# Patient Record
Sex: Female | Born: 1975 | Race: White | Hispanic: No | Marital: Married
Health system: Southern US, Community
[De-identification: ages and names within clinical notes are randomized; demographics above are authoritative.]

## PROBLEM LIST (undated history)

## (undated) DIAGNOSIS — F32A Depression, unspecified: Secondary | ICD-10-CM

## (undated) DIAGNOSIS — J45909 Unspecified asthma, uncomplicated: Secondary | ICD-10-CM

## (undated) DIAGNOSIS — F419 Anxiety disorder, unspecified: Secondary | ICD-10-CM

## (undated) DIAGNOSIS — R7303 Prediabetes: Secondary | ICD-10-CM

## (undated) DIAGNOSIS — C539 Malignant neoplasm of cervix uteri, unspecified: Secondary | ICD-10-CM

## (undated) DIAGNOSIS — M549 Dorsalgia, unspecified: Secondary | ICD-10-CM

## (undated) DIAGNOSIS — E079 Disorder of thyroid, unspecified: Secondary | ICD-10-CM

## (undated) DIAGNOSIS — G2581 Restless legs syndrome: Secondary | ICD-10-CM

## (undated) HISTORY — DX: Anxiety disorder, unspecified: F41.9

## (undated) HISTORY — DX: Depression, unspecified: F32.A

## (undated) HISTORY — DX: Disorder of thyroid, unspecified: E07.9

## (undated) HISTORY — DX: Dorsalgia, unspecified: M54.9

## (undated) HISTORY — DX: Malignant neoplasm of cervix uteri, unspecified: C53.9

## (undated) HISTORY — PX: CARPAL TUNNEL RELEASE: SHX101

## (undated) HISTORY — PX: HYSTERECTOMY ABDOMINAL WITH SALPINGECTOMY: SHX6725

## (undated) HISTORY — PX: CHOLECYSTECTOMY: SHX55

## (undated) HISTORY — DX: Restless legs syndrome: G25.81

## (undated) HISTORY — DX: Unspecified asthma, uncomplicated: J45.909

---

## 2006-11-27 ENCOUNTER — Emergency Department: Payer: Self-pay | Admitting: Unknown Physician Specialty

## 2007-09-02 ENCOUNTER — Emergency Department: Payer: Self-pay | Admitting: Emergency Medicine

## 2007-11-27 ENCOUNTER — Ambulatory Visit: Payer: Self-pay | Admitting: Family Medicine

## 2008-05-27 ENCOUNTER — Emergency Department: Payer: Self-pay | Admitting: Emergency Medicine

## 2008-06-17 ENCOUNTER — Emergency Department: Payer: Self-pay | Admitting: Emergency Medicine

## 2008-12-02 ENCOUNTER — Ambulatory Visit: Payer: Self-pay

## 2011-08-03 DIAGNOSIS — Z8541 Personal history of malignant neoplasm of cervix uteri: Secondary | ICD-10-CM | POA: Insufficient documentation

## 2012-04-28 DIAGNOSIS — E038 Other specified hypothyroidism: Secondary | ICD-10-CM | POA: Insufficient documentation

## 2012-04-28 DIAGNOSIS — E039 Hypothyroidism, unspecified: Secondary | ICD-10-CM | POA: Insufficient documentation

## 2013-09-10 DIAGNOSIS — M5136 Other intervertebral disc degeneration, lumbar region: Secondary | ICD-10-CM | POA: Insufficient documentation

## 2013-09-10 DIAGNOSIS — M797 Fibromyalgia: Secondary | ICD-10-CM | POA: Insufficient documentation

## 2013-10-12 DIAGNOSIS — Z9049 Acquired absence of other specified parts of digestive tract: Secondary | ICD-10-CM | POA: Insufficient documentation

## 2013-11-16 LAB — IRON,TIBC AND FERRITIN PANEL
%SAT: 10
Ferritin: 51
Iron: 36
TIBC: 359

## 2014-02-26 DIAGNOSIS — Z8659 Personal history of other mental and behavioral disorders: Secondary | ICD-10-CM | POA: Insufficient documentation

## 2014-06-03 DIAGNOSIS — G5601 Carpal tunnel syndrome, right upper limb: Secondary | ICD-10-CM | POA: Insufficient documentation

## 2014-08-11 DIAGNOSIS — Z87891 Personal history of nicotine dependence: Secondary | ICD-10-CM

## 2016-03-25 ENCOUNTER — Emergency Department
Admission: EM | Admit: 2016-03-25 | Discharge: 2016-03-25 | Disposition: A | Discharge status: Home / Self Care | Payer: Self-pay | Attending: Emergency Medicine | Admitting: Emergency Medicine

## 2016-03-25 ENCOUNTER — Encounter: Payer: Self-pay | Admitting: Emergency Medicine

## 2016-03-25 ENCOUNTER — Emergency Department: Payer: Self-pay

## 2016-03-25 DIAGNOSIS — F17210 Nicotine dependence, cigarettes, uncomplicated: Secondary | ICD-10-CM | POA: Insufficient documentation

## 2016-03-25 DIAGNOSIS — J189 Pneumonia, unspecified organism: Secondary | ICD-10-CM | POA: Insufficient documentation

## 2016-03-25 HISTORY — DX: Prediabetes: R73.03

## 2016-03-25 MED ORDER — AZITHROMYCIN 250 MG PO TABS: ORAL_TABLET | ORAL | 0 refills | Status: DC

## 2016-03-25 MED ORDER — PSEUDOEPH-BROMPHEN-DM 30-2-10 MG/5ML PO SYRP: 10.0000 mL | ORAL_SOLUTION | Freq: Four times a day (QID) | ORAL | 0 refills | Status: DC | PRN

## 2016-03-25 MED ORDER — PREDNISONE 10 MG PO TABS: 10.0000 mg | ORAL_TABLET | Freq: Every day | ORAL | 0 refills | Status: DC

## 2016-03-25 MED ORDER — ALBUTEROL SULFATE HFA 108 (90 BASE) MCG/ACT IN AERS: 2.0000 | INHALATION_SPRAY | RESPIRATORY_TRACT | 0 refills | Status: DC | PRN

## 2016-03-25 NOTE — ED Triage Notes (Signed)
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 Pt states pain in her ribs and her back with cough at this time. Pt states "you know how you can taste an infection in your chest? I can taste it". Pt is noted to have cough in triage.

## 2016-03-25 NOTE — ED Provider Notes (Signed)
[facility] Emergency Department Provider Note  ____________________________________________  Time seen: Approximately 5:40 PM  I have reviewed the triage vital signs and the nursing notes.   HISTORY  Chief Complaint Cough and Pleurisy    HPI Cynthia Thompson is a 41 y.o. female who presents emergency department complaining of chronic cough 3-4 weeks. Per the patient, she works at a truck stop and states that were the truckers came in approximately 4 weeks ago, coughing around and on her. She reports that he had "pneumonia or something." Patient reports that she has had a cough with no other symptoms over the last 3 weeks. She states that overall she has felt "fine until the past several days" in which she reports that she has felt more tired and fatigued in the cough has worsened. She denies any fevers or chills, night sweats, hemoptysis. She denies any headache, visual changes, chest pain, shortness of breath, abdominal pain, nausea vomiting, diarrhea or constipation. She has tried Mucinex and Motrin for this complaint prior to arrival.   Past Medical History:  Diagnosis Date  . Borderline diabetes     There are no active problems to display for this patient.   Past Surgical History:  Procedure Laterality Date  . CARPAL TUNNEL RELEASE    . CHOLECYSTECTOMY    . HYSTERECTOMY ABDOMINAL WITH SALPINGECTOMY      Prior to Admission medications   Medication Sig Start Date End Date Taking? Authorizing Provider  albuterol (PROVENTIL HFA;VENTOLIN HFA) 108 (90 Base) MCG/ACT inhaler Inhale 2 puffs into the lungs every 4 (four) hours as needed for wheezing or shortness of breath. 03/25/16   Delorise Royals Cuthriell, PA-C  azithromycin (ZITHROMAX Z-PAK) 250 MG tablet Take 2 tablets (500 mg) on  Day 1,  followed by 1 tablet (250 mg) once daily on Days 2 through 5. 03/25/16   Christiane Ha D Cuthriell, PA-C  brompheniramine-pseudoephedrine-DM 30-2-10 MG/5ML syrup Take 10 mLs by  mouth 4 (four) times daily as needed. 03/25/16   Delorise Royals Cuthriell, PA-C  predniSONE (DELTASONE) 10 MG tablet Take 1 tablet (10 mg total) by mouth daily. 03/25/16   Delorise Royals Cuthriell, PA-C    Allergies Patient has no known allergies.  No family history on file.  Social History Social History  Substance Use Topics  . Smoking status: Current Every Day Smoker    Packs/day: 0.50    Types: Cigarettes  . Smokeless tobacco: Never Used  . Alcohol use No     Review of Systems  Constitutional: No fever/chills Eyes: No visual changes. No discharge ENT: No upper respiratory complaints. Cardiovascular: no chest pain. Respiratory: Positive cough. No SOB. Gastrointestinal: No abdominal pain.  No nausea, no vomiting.  No diarrhea.  No constipation. Musculoskeletal: Negative for musculoskeletal pain. Skin: Negative for rash, abrasions, lacerations, ecchymosis. Neurological: Negative for headaches, focal weakness or numbness. 10-point ROS otherwise negative.  ____________________________________________   PHYSICAL EXAM:  VITAL SIGNS: ED Triage Vitals  Enc Vitals Group     BP 03/25/16 1615 (!) 136/53     Pulse Rate 03/25/16 1615 86     Resp 03/25/16 1615 18     Temp 03/25/16 1615 97.9 F (36.6 C)     Temp Source 03/25/16 1615 Oral     SpO2 03/25/16 1615 98 %     Weight 03/25/16 1620 170 lb (77.1 kg)     Height 03/25/16 1620 5\' 1"  (1.549 m)     Head Circumference --      Peak Flow --  Pain Score 03/25/16 1615 10     Pain Loc --      Pain Edu? --      Excl. in GC? --      Constitutional: Alert and oriented. Well appearing and in no acute distress. Eyes: Conjunctivae are normal. PERRL. EOMI. Head: Atraumatic. ENT:      Ears: EACs and TMs unremarkable bilaterally      Nose: No congestion/rhinnorhea.      Mouth/Throat: Mucous membranes are moist.  Neck: No stridor. Neck is supple with full range of motion Hematological/Lymphatic/Immunilogical: No cervical  lymphadenopathy. Cardiovascular: Normal rate, regular rhythm. Normal S1 and S2.  Good peripheral circulation. Respiratory: Normal respiratory effort without tachypnea or retractions. Lungs with a few scattered expiratory wheezes. No rales or rhonchi.Peri Jefferson air entry to the bases with no decreased or absent breath sounds. Musculoskeletal: Full range of motion to all extremities. No gross deformities appreciated. Neurologic:  Normal speech and language. No gross focal neurologic deficits are appreciated.  Skin:  Skin is warm, dry and intact. No rash noted. Psychiatric: Mood and affect are normal. Speech and behavior are normal. Patient exhibits appropriate insight and judgement.   ____________________________________________   LABS (all labs ordered are listed, but only abnormal results are displayed)  Labs Reviewed - No data to display ____________________________________________  EKG   ____________________________________________  RADIOLOGY Festus Barren Cuthriell, personally viewed and evaluated these images (plain radiographs) as part of my medical decision making, as well as reviewing the written report by the radiologist.  Dg Chest 2 View  Result Date: 03/25/2016 CLINICAL DATA:  Cough and congestion for 3 weeks. EXAM: CHEST  2 VIEW COMPARISON:  11/27/2007 chest radiograph FINDINGS: The cardiomediastinal silhouette is unremarkable. There is no evidence of focal airspace disease, pulmonary edema, suspicious pulmonary nodule/mass, pleural effusion, or pneumothorax. No acute bony abnormalities are identified. IMPRESSION: No active cardiopulmonary disease. Electronically Signed   By: Harmon Pier M.D.   On: 03/25/2016 16:51    ____________________________________________    PROCEDURES  Procedure(s) performed:    Procedures    Medications - No data to display   ____________________________________________   INITIAL IMPRESSION / ASSESSMENT AND PLAN / ED  COURSE  Pertinent labs & imaging results that were available during my care of the patient were reviewed by me and considered in my medical decision making (see chart for details).  Review of the [place] CSRS was performed in accordance of the [facility] prior to dispensing any controlled drugs.     Patient's diagnosis is consistent with community acquired pneumonia. The patient's symptoms of 3-4 weeks of cough, it is likely a patient has Mycoplasma/walking pneumonia. X-ray reveals no consolidation or  lesions.. Patient will be discharged home with prescriptions for antibiotics, steroids, albuterol, cough syrup. Patient is to follow up with primary care as needed or otherwise directed. Patient is given ED precautions to return to the ED for any worsening or new symptoms.     ____________________________________________  FINAL CLINICAL IMPRESSION(S) / ED DIAGNOSES  Final diagnoses:  Community acquired pneumonia, unspecified laterality      NEW MEDICATIONS STARTED DURING THIS VISIT:  New Prescriptions   ALBUTEROL (PROVENTIL HFA;VENTOLIN HFA) 108 (90 BASE) MCG/ACT INHALER    Inhale 2 puffs into the lungs every 4 (four) hours as needed for wheezing or shortness of breath.   AZITHROMYCIN (ZITHROMAX Z-PAK) 250 MG TABLET    Take 2 tablets (500 mg) on  Day 1,  followed by 1 tablet (250 mg) once daily on Days  2 through 5.   BROMPHENIRAMINE-PSEUDOEPHEDRINE-DM 30-2-10 MG/5ML SYRUP    Take 10 mLs by mouth 4 (four) times daily as needed.   PREDNISONE (DELTASONE) 10 MG TABLET    Take 1 tablet (10 mg total) by mouth daily.        This chart was dictated using voice recognition software/Dragon. Despite best efforts to proofread, errors can occur which can change the meaning. Any change was purely unintentional.    Racheal PatchesJonathan D Cuthriell, PA-C 03/25/16 1743    Emily FilbertJonathan E Williams, MD 03/25/16 2013

## 2018-11-21 DIAGNOSIS — F419 Anxiety disorder, unspecified: Secondary | ICD-10-CM | POA: Insufficient documentation

## 2018-11-21 LAB — TSH: TSH: 17.27 — AB (ref 0.41–5.90)

## 2019-05-16 DIAGNOSIS — N200 Calculus of kidney: Secondary | ICD-10-CM | POA: Insufficient documentation

## 2019-09-20 ENCOUNTER — Other Ambulatory Visit: Payer: Self-pay

## 2019-09-20 ENCOUNTER — Emergency Department
Admission: EM | Admit: 2019-09-20 | Discharge: 2019-09-20 | Disposition: A | Discharge status: Home / Self Care | Payer: Medicaid - Out of State | Attending: Emergency Medicine | Admitting: Emergency Medicine

## 2019-09-20 ENCOUNTER — Emergency Department: Payer: Medicaid - Out of State

## 2019-09-20 DIAGNOSIS — Y929 Unspecified place or not applicable: Secondary | ICD-10-CM | POA: Diagnosis not present

## 2019-09-20 DIAGNOSIS — F17210 Nicotine dependence, cigarettes, uncomplicated: Secondary | ICD-10-CM | POA: Diagnosis not present

## 2019-09-20 DIAGNOSIS — S00261A Insect bite (nonvenomous) of right eyelid and periocular area, initial encounter: Secondary | ICD-10-CM | POA: Insufficient documentation

## 2019-09-20 DIAGNOSIS — W57XXXA Bitten or stung by nonvenomous insect and other nonvenomous arthropods, initial encounter: Secondary | ICD-10-CM | POA: Insufficient documentation

## 2019-09-20 DIAGNOSIS — Y939 Activity, unspecified: Secondary | ICD-10-CM | POA: Insufficient documentation

## 2019-09-20 DIAGNOSIS — X509XXA Other and unspecified overexertion or strenuous movements or postures, initial encounter: Secondary | ICD-10-CM | POA: Insufficient documentation

## 2019-09-20 DIAGNOSIS — Y999 Unspecified external cause status: Secondary | ICD-10-CM | POA: Insufficient documentation

## 2019-09-20 DIAGNOSIS — S93601A Unspecified sprain of right foot, initial encounter: Secondary | ICD-10-CM | POA: Insufficient documentation

## 2019-09-20 MED ORDER — PREDNISONE 10 MG PO TABS: 30.0000 mg | ORAL_TABLET | Freq: Every day | ORAL | 0 refills | Status: DC

## 2019-09-20 MED ORDER — DOXYCYCLINE HYCLATE 100 MG PO CAPS: 100.0000 mg | ORAL_CAPSULE | Freq: Two times a day (BID) | ORAL | 0 refills | Status: DC

## 2019-09-20 NOTE — Discharge Instructions (Signed)
Follow-up with your regular doctor if not improving in 3 to 5 days.  Return emergency department worsening.  Follow-up with orthopedics if you continue to have right foot pain.  Elevate and ice the foot.  Take Tylenol for pain as needed.

## 2019-09-20 NOTE — ED Provider Notes (Signed)
[facility] Emergency Department Provider Note  ____________________________________________   First MD Initiated Contact with Patient 09/20/19 1406     (approximate)  I have reviewed the triage vital signs and the nursing notes.   HISTORY  Chief Complaint Insect Bite    HPI Cynthia Thompson is a 44 y.o. female presents emergency department complaining of swelling to the area next to her right eye from a insect bite 2 days ago. States that some yellow clear drainage was coming out of the area it still stings and burns. Also patient is complaining of right foot pain. States she twisted it about 3 weeks ago and continues to have pain along the bottom of the foot. Sharp stinging and tingling. No other injuries or complaints at this time.    Past Medical History:  Diagnosis Date  . Borderline diabetes     There are no problems to display for this patient.   Past Surgical History:  Procedure Laterality Date  . CARPAL TUNNEL RELEASE    . CHOLECYSTECTOMY    . HYSTERECTOMY ABDOMINAL WITH SALPINGECTOMY      Prior to Admission medications   Medication Sig Start Date End Date Taking? Authorizing Provider  doxycycline (VIBRAMYCIN) 100 MG capsule Take 1 capsule (100 mg total) by mouth 2 (two) times daily. 09/20/19   Jaspreet Hollings, Roselyn Bering, PA-C  predniSONE (DELTASONE) 10 MG tablet Take 3 tablets (30 mg total) by mouth daily with breakfast. 09/20/19   Sherrie Mustache Roselyn Bering, PA-C    Allergies Cephalexin, Ibuprofen, and Latex  No family history on file.  Social History Social History   Tobacco Use  . Smoking status: Current Every Day Smoker    Packs/day: 1.00    Types: Cigarettes  . Smokeless tobacco: Never Used  Vaping Use  . Vaping Use: Never used  Substance Use Topics  . Alcohol use: No  . Drug use: No    Review of Systems  Constitutional: No fever/chills Eyes: No visual changes. ENT: No sore throat. Respiratory: Denies cough Cardiovascular: Denies chest  pain Gastrointestinal: Denies abdominal pain Genitourinary: Negative for dysuria. Musculoskeletal: Negative for back pain. Positive right foot pain Skin: Negative for rash. Positive bug bite Psychiatric: no mood changes,     ____________________________________________   PHYSICAL EXAM:  VITAL SIGNS: ED Triage Vitals  Enc Vitals Group     BP 09/20/19 1259 111/72     Pulse Rate 09/20/19 1259 (!) 53     Resp 09/20/19 1259 19     Temp 09/20/19 1259 98.1 F (36.7 C)     Temp Source 09/20/19 1259 Oral     SpO2 09/20/19 1259 97 %     Weight 09/20/19 1303 164 lb 14.5 oz (74.8 kg)     Height 09/20/19 1303 5\' 1"  (1.549 m)     Head Circumference --      Peak Flow --      Pain Score 09/20/19 1302 10     Pain Loc --      Pain Edu? --      Excl. in GC? --     Constitutional: Alert and oriented. Well appearing and in no acute distress. Eyes: Conjunctivae are normal. Scabbed bug bite area noted adjacent to the right eye, no drainage at this time, no cellulitis extending to the lid Head: Atraumatic. Nose: No congestion/rhinnorhea. Mouth/Throat: Mucous membranes are moist.   Neck:  supple no lymphadenopathy noted Cardiovascular: Normal rate, regular rhythm.  Respiratory: Normal respiratory effort.  No retractions, l  GU: deferred Musculoskeletal: FROM all extremities, warm and well perfused, right foot is tender along the dorsum around the 3rd and 4th toes, neurovascular is intact no bruising is noted Neurologic:  Normal speech and language.  Skin:  Skin is warm, dry and intact. No rash noted. Psychiatric: Mood and affect are normal. Speech and behavior are normal.  ____________________________________________   LABS (all labs ordered are listed, but only abnormal results are displayed)  Labs Reviewed - No data to display ____________________________________________   ____________________________________________  RADIOLOGY  X-ray the right foot is  negative  ____________________________________________   PROCEDURES  Procedure(s) performed: No  Procedures    ____________________________________________   INITIAL IMPRESSION / ASSESSMENT AND PLAN / ED COURSE  Pertinent labs & imaging results that were available during my care of the patient were reviewed by me and considered in my medical decision making (see chart for details).   Patient is 44 year old female who comes in the emergency department with 2 complaints. One is a bug bite next to her eye and the other is right foot pain. See HPI physical exam is consistent with a insect bite and right foot sprain.   Did order x-ray of the right foot due to the continued pain. Right foot x-rays negative for fracture.  I did explain the findings to the patient. Explained her this could be a tendon problem she should follow-up with orthopedics. Gave her a prescription for Keflex due to concerns of infection at the bug bite along with 30 mg prednisone for 3 days. She is to continue to wash the area with soap and water. Return emergency department for worsening. She discharged stable condition.     Cynthia Thompson was evaluated in Emergency Department on 09/20/2019 for the symptoms described in the history of present illness. She was evaluated in the context of the global COVID-19 pandemic, which necessitated consideration that the patient might be at risk for infection with the SARS-CoV-2 virus that causes COVID-19. Institutional protocols and algorithms that pertain to the evaluation of patients at risk for COVID-19 are in a state of rapid change based on information released by regulatory bodies including the CDC and federal and state organizations. These policies and algorithms were followed during the patient's care in the ED.    As part of my medical decision making, I reviewed the following data within the electronic MEDICAL RECORD NUMBER Nursing notes reviewed and incorporated, Old chart  reviewed, Radiograph reviewed , Notes from prior ED visits and [place] Controlled Substance Database  ____________________________________________   FINAL CLINICAL IMPRESSION(S) / ED DIAGNOSES  Final diagnoses:  Insect bite of right periocular area, initial encounter  Sprain of right foot, initial encounter      NEW MEDICATIONS STARTED DURING THIS VISIT:  New Prescriptions   DOXYCYCLINE (VIBRAMYCIN) 100 MG CAPSULE    Take 1 capsule (100 mg total) by mouth 2 (two) times daily.   PREDNISONE (DELTASONE) 10 MG TABLET    Take 3 tablets (30 mg total) by mouth daily with breakfast.     Note:  This document was prepared using Dragon voice recognition software and may include unintentional dictation errors.    Faythe Ghee, PA-C 09/20/19 1634    Chesley Noon, MD 09/21/19 [phone]

## 2019-09-20 NOTE — ED Notes (Signed)
See triage note  Presents with pain and swelling to right eye  States she was stung by something on weds  Swelling noted Friday and Saturday

## 2019-09-20 NOTE — ED Triage Notes (Signed)
Patient arrived via POV from home, AOx4 and ambulatory. Patient chief complaint is insect bite that happened on September 1st. Patient was bitten on right side of eye. No vision loss or redness. Eye lid is swollen.

## 2019-10-16 ENCOUNTER — Emergency Department: Payer: Medicaid - Out of State

## 2019-10-16 ENCOUNTER — Other Ambulatory Visit: Payer: Self-pay

## 2019-10-16 DIAGNOSIS — Z5321 Procedure and treatment not carried out due to patient leaving prior to being seen by health care provider: Secondary | ICD-10-CM | POA: Insufficient documentation

## 2019-10-16 DIAGNOSIS — M549 Dorsalgia, unspecified: Secondary | ICD-10-CM | POA: Insufficient documentation

## 2019-10-16 DIAGNOSIS — R059 Cough, unspecified: Secondary | ICD-10-CM | POA: Diagnosis not present

## 2019-10-16 DIAGNOSIS — R0781 Pleurodynia: Secondary | ICD-10-CM | POA: Insufficient documentation

## 2019-10-16 NOTE — ED Triage Notes (Signed)
Pt states coming in for a cough that has resulted in back and rib pain. Pt states cough started 1.5 weeks ago. Pt states her niece has been sick. Pt states she feels dehydrated and has episodes of "hot then cold."

## 2019-10-17 ENCOUNTER — Emergency Department
Admission: EM | Admit: 2019-10-17 | Discharge: 2019-10-18 | Disposition: A | Discharge status: Home / Self Care | Payer: Medicaid - Out of State | Attending: Emergency Medicine | Admitting: Emergency Medicine

## 2019-10-17 NOTE — ED Notes (Signed)
Called pt x2 without answer 

## 2020-06-27 ENCOUNTER — Emergency Department: Payer: Medicaid - Out of State

## 2020-06-27 ENCOUNTER — Encounter: Payer: Self-pay | Admitting: Radiology

## 2020-06-27 ENCOUNTER — Other Ambulatory Visit: Payer: Self-pay

## 2020-06-27 ENCOUNTER — Emergency Department
Admission: EM | Admit: 2020-06-27 | Discharge: 2020-06-27 | Disposition: A | Discharge status: Home / Self Care | Payer: Medicaid - Out of State | Attending: Emergency Medicine | Admitting: Emergency Medicine

## 2020-06-27 DIAGNOSIS — F17210 Nicotine dependence, cigarettes, uncomplicated: Secondary | ICD-10-CM | POA: Insufficient documentation

## 2020-06-27 DIAGNOSIS — Z20822 Contact with and (suspected) exposure to covid-19: Secondary | ICD-10-CM | POA: Diagnosis not present

## 2020-06-27 DIAGNOSIS — J069 Acute upper respiratory infection, unspecified: Secondary | ICD-10-CM | POA: Diagnosis not present

## 2020-06-27 DIAGNOSIS — R059 Cough, unspecified: Secondary | ICD-10-CM | POA: Diagnosis present

## 2020-06-27 DIAGNOSIS — J45901 Unspecified asthma with (acute) exacerbation: Secondary | ICD-10-CM | POA: Insufficient documentation

## 2020-06-27 DIAGNOSIS — Z28310 Unvaccinated for covid-19: Secondary | ICD-10-CM | POA: Diagnosis not present

## 2020-06-27 DIAGNOSIS — Z91010 Allergy to peanuts: Secondary | ICD-10-CM | POA: Insufficient documentation

## 2020-06-27 LAB — CBC
HCT: 40.9 % (ref 36.0–46.0)
Hemoglobin: 13.1 g/dL (ref 12.0–15.0)
MCH: 26.5 pg (ref 26.0–34.0)
MCHC: 32 g/dL (ref 30.0–36.0)
MCV: 82.8 fL (ref 80.0–100.0)
Platelets: 277 10*3/uL (ref 150–400)
RBC: 4.94 MIL/uL (ref 3.87–5.11)
RDW: 16.4 % — ABNORMAL HIGH (ref 11.5–15.5)
WBC: 10.8 10*3/uL — ABNORMAL HIGH (ref 4.0–10.5)
nRBC: 0 % (ref 0.0–0.2)

## 2020-06-27 LAB — BASIC METABOLIC PANEL
Anion gap: 5 (ref 5–15)
BUN: 16 mg/dL (ref 6–20)
CO2: 27 mmol/L (ref 22–32)
Calcium: 8.9 mg/dL (ref 8.9–10.3)
Chloride: 109 mmol/L (ref 98–111)
Creatinine, Ser: 0.77 mg/dL (ref 0.44–1.00)
GFR, Estimated: >60 mL/min (ref >60)
Glucose, Bld: 96 mg/dL (ref 70–99)
Potassium: 4.3 mmol/L (ref 3.5–5.1)
Sodium: 141 mmol/L (ref 135–145)

## 2020-06-27 LAB — TROPONIN I (HIGH SENSITIVITY): Troponin I (High Sensitivity): 4 ng/L (ref <18)

## 2020-06-27 LAB — RESP PANEL BY RT-PCR (FLU A&B, COVID) ARPGX2
Influenza A by PCR: NEGATIVE
Influenza B by PCR: NEGATIVE
SARS Coronavirus 2 by RT PCR: NEGATIVE

## 2020-06-27 LAB — D-DIMER, QUANTITATIVE: D-Dimer, Quant: 0.32 ug/mL-FEU (ref 0.00–0.50)

## 2020-06-27 MED ORDER — MAGNESIUM SULFATE 2 GM/50ML IV SOLN
2.0000 g | Freq: Once | INTRAVENOUS | Status: AC
  Administered 2020-06-27: 2 g via INTRAVENOUS
  Filled 2020-06-27: qty 50

## 2020-06-27 MED ORDER — METHYLPREDNISOLONE SODIUM SUCC 125 MG IJ SOLR
125.0000 mg | Freq: Once | INTRAMUSCULAR | Status: AC
  Administered 2020-06-27: 125 mg via INTRAVENOUS
  Filled 2020-06-27: qty 2

## 2020-06-27 MED ORDER — IPRATROPIUM-ALBUTEROL 0.5-2.5 (3) MG/3ML IN SOLN
3.0000 mL | Freq: Once | RESPIRATORY_TRACT | Status: AC
  Administered 2020-06-27: 3 mL via RESPIRATORY_TRACT
  Filled 2020-06-27: qty 3

## 2020-06-27 MED ORDER — PREDNISONE 20 MG PO TABS: 60.0000 mg | ORAL_TABLET | Freq: Every day | ORAL | 0 refills | Status: AC

## 2020-06-27 MED ORDER — ALBUTEROL SULFATE HFA 108 (90 BASE) MCG/ACT IN AERS: 2.0000 | INHALATION_SPRAY | Freq: Four times a day (QID) | RESPIRATORY_TRACT | 2 refills | Status: DC | PRN

## 2020-06-27 MED ORDER — ACETAMINOPHEN 500 MG PO TABS
1000.0000 mg | ORAL_TABLET | Freq: Once | ORAL | Status: AC
  Administered 2020-06-27: 1000 mg via ORAL
  Filled 2020-06-27: qty 2

## 2020-06-27 MED ORDER — ALBUTEROL SULFATE HFA 108 (90 BASE) MCG/ACT IN AERS
2.0000 | INHALATION_SPRAY | RESPIRATORY_TRACT | Status: DC | PRN
  Administered 2020-06-27: 2 via RESPIRATORY_TRACT
  Filled 2020-06-27: qty 6.7

## 2020-06-27 MED ORDER — ALBUTEROL SULFATE HFA 108 (90 BASE) MCG/ACT IN AERS: 2.0000 | INHALATION_SPRAY | RESPIRATORY_TRACT | Status: DC | PRN

## 2020-06-27 NOTE — ED Provider Notes (Signed)
[facility] Emergency Department Provider Note  ____________________________________________  Time seen: Approximately 3:58 AM  I have reviewed the triage vital signs and the nursing notes.   HISTORY  Chief Complaint Wheezing   HPI Cynthia Thompson is a 45 y.o. female with a history of asthma and smoking who presents for evaluation of shortness of breath.  Patient reports that she has had a dry cough and some hoarseness that started yesterday.  Has been having progressively worsening wheezing and shortness of breath.  Unfortunately she does not have her inhaler home.  This evening the shortness of breath became more severe.  She is also complaining of sharp right-sided chest pain radiating across to her upper back.  The pain started after the cough.  No personal or family history of blood clots, recent travel immobilization, leg pain or swelling, hemoptysis or exogenous hormones.  She denies fever or chills.  Has not been vaccinated against COVID.  Denies body aches vomiting or diarrhea  Past Medical History:  Diagnosis Date   Borderline diabetes     There are no problems to display for this patient.   Past Surgical History:  Procedure Laterality Date   CARPAL TUNNEL RELEASE     CHOLECYSTECTOMY     HYSTERECTOMY ABDOMINAL WITH SALPINGECTOMY      Prior to Admission medications   Medication Sig Start Date End Date Taking? Authorizing Provider  albuterol (VENTOLIN HFA) 108 (90 Base) MCG/ACT inhaler Inhale 2 puffs into the lungs every 6 (six) hours as needed for wheezing or shortness of breath. 06/27/20  Yes Don Perking, [place], [place]  predniSONE (DELTASONE) 20 MG tablet Take 3 tablets (60 mg total) by mouth daily with breakfast for 4 days. 06/27/20 07/01/20 Yes Charrisse Masley, [place], MD  doxycycline (VIBRAMYCIN) 100 MG capsule Take 1 capsule (100 mg total) by mouth 2 (two) times daily. 09/20/19   Faythe Ghee, PA-C    Allergies Cephalexin, Ibuprofen, and  Latex  No family history on file.  Social History Social History   Tobacco Use   Smoking status: Every Day    Packs/day: 1.00    Pack years: 0.00    Types: Cigarettes   Smokeless tobacco: Never  Vaping Use   Vaping Use: Never used  Substance Use Topics   Alcohol use: No   Drug use: No    Review of Systems  Constitutional: Negative for fever. Eyes: Negative for visual changes. ENT: Negative for sore throat. Neck: No neck pain  Cardiovascular: + R sided pleuritic chest pain. Respiratory: + shortness of breath, cough, wheezing Gastrointestinal: Negative for abdominal pain, vomiting or diarrhea. Genitourinary: Negative for dysuria. Musculoskeletal: Negative for back pain. Skin: Negative for rash. Neurological: Negative for headaches, weakness or numbness. Psych: No SI or HI  ____________________________________________   PHYSICAL EXAM:  VITAL SIGNS: ED Triage Vitals  Enc Vitals Group     BP 06/27/20 0219 (!) 119/58     Pulse Rate 06/27/20 0217 61     Resp 06/27/20 0217 (!) 24     Temp 06/27/20 0217 98.2 F (36.8 C)     Temp Source 06/27/20 0217 Oral     SpO2 06/27/20 0217 93 %     Weight 06/27/20 0217 170 lb (77.1 kg)     Height 06/27/20 0217 5\' 1"  (1.549 m)     Head Circumference --      Peak Flow --      Pain Score 06/27/20 0217 10     Pain Loc --  Pain Edu? --      Excl. in GC? --     Constitutional: Alert and oriented, mild respiratory distress  HEENT:      Head: Normocephalic and atraumatic.         Eyes: Conjunctivae are normal. Sclera is non-icteric.       Mouth/Throat: Mucous membranes are moist.       Neck: Supple with no signs of meningismus. Cardiovascular: Regular rate and rhythm. No murmurs, gallops, or rubs. 2+ symmetrical distal pulses are present in all extremities. No JVD. Respiratory: Mild increased work of breathing with tachypnea, wheezing bilaterally but not hypoxic Gastrointestinal: Soft, non tender, and non  distended. Musculoskeletal:  No edema, cyanosis, or erythema of extremities. Neurologic: Normal speech and language. Face is symmetric. Moving all extremities. No gross focal neurologic deficits are appreciated. Skin: Skin is warm, dry and intact. No rash noted. Psychiatric: Mood and affect are normal. Speech and behavior are normal.  ____________________________________________   LABS (all labs ordered are listed, but only abnormal results are displayed)  Labs Reviewed  CBC - Abnormal; Notable for the following components:      Result Value   WBC 10.8 (*)    RDW 16.4 (*)    All other components within normal limits  RESP PANEL BY RT-PCR (FLU A&B, COVID) ARPGX2  D-DIMER, QUANTITATIVE  BASIC METABOLIC PANEL  TROPONIN I (HIGH SENSITIVITY)   ____________________________________________  EKG  ED ECG REPORT I, Nita Sickle, the attending physician, personally viewed and interpreted this ECG.  Normal sinus rhythm, rate of 60, normal intervals, normal axis, no ST elevations or depressions ____________________________________________  RADIOLOGY  I have personally reviewed the images performed during this visit and I agree with the Radiologist's read.   Interpretation by Radiologist:  DG Chest 2 View  Result Date: 06/27/2020 CLINICAL DATA:  Shortness of breath EXAM: CHEST - 2 VIEW COMPARISON:  Radiograph 03/25/2016 FINDINGS: No consolidation, features of edema, pneumothorax, or effusion. Pulmonary vascularity is normally distributed. The cardiomediastinal contours are unremarkable. Telemetry leads overlie the chest. Degenerative changes are present in the imaged spine and shoulders. No acute or worrisome osseous or chest wall abnormalities. IMPRESSION: No acute cardiopulmonary abnormality. Electronically Signed   By: Kreg Shropshire M.D.   On: 06/27/2020 03:10     ____________________________________________   PROCEDURES  Procedure(s) performed:yes .1-3 Lead EKG  Interpretation  Date/Time: 06/27/2020 4:01 AM Performed by: Nita Sickle, MD Authorized by: Nita Sickle, MD     Interpretation: non-specific     ECG rate assessment: normal     Rhythm: sinus rhythm     Ectopy: none     Conduction: normal    Critical Care performed: none ____________________________________________   INITIAL IMPRESSION / ASSESSMENT AND PLAN / ED COURSE  45 y.o. female with a history of asthma and smoking who presents for evaluation of shortness of breath, dry cough, hoarseness, congestion, and wheezing.  Patient also complained of right-sided pleuritic chest pain.  Patient arrives in mild respiratory distress, wheezing and tachypneic with normal sats.  Looks euvolemic.  No asymmetric leg swelling.  EKG with no signs of ischemia, dysrhythmias, or pericarditis.  Here she is afebrile.  Troponin negative with no signs of myocarditis.  COVID and flu negative.  D-dimer was sent to due to the pleuritic chest pain and that was negative for PE.  Chest x-ray visualized by me with no acute findings, confirmed by radiology.  No significant electrolyte derangements.  Patient was given 3 duo nebs, Solu-Medrol and IV magnesium  with significant improvement of her symptoms.  Sats are now 95 to 98%.  She is moving good air.  Since patient does not have albuterol at home we will send her home with an inhaler.  4 more days of prednisone and close follow-up with her primary care doctor.  Discussed my standard return precautions      _____________________________________________ Please note:  Patient was evaluated in Emergency Department today for the symptoms described in the history of present illness. Patient was evaluated in the context of the global COVID-19 pandemic, which necessitated consideration that the patient might be at risk for infection with the SARS-CoV-2 virus that causes COVID-19. Institutional protocols and algorithms that pertain to the evaluation of patients at  risk for COVID-19 are in a state of rapid change based on information released by regulatory bodies including the CDC and federal and state organizations. These policies and algorithms were followed during the patient's care in the ED.  Some ED evaluations and interventions may be delayed as a result of limited staffing during the pandemic.   [place] Controlled Substance Database was reviewed by me. ____________________________________________   FINAL CLINICAL IMPRESSION(S) / ED DIAGNOSES   Final diagnoses:  Viral URI with cough  Exacerbation of asthma, unspecified asthma severity, unspecified whether persistent      NEW MEDICATIONS STARTED DURING THIS VISIT:  ED Discharge Orders          Ordered    albuterol (VENTOLIN HFA) 108 (90 Base) MCG/ACT inhaler  Every 6 hours PRN        06/27/20 0403    predniSONE (DELTASONE) 20 MG tablet  Daily with breakfast        06/27/20 0403             Note:  This document was prepared using Dragon voice recognition software and may include unintentional dictation errors.    Don Perking, [place], MD 06/27/20 [phone]

## 2020-06-27 NOTE — ED Triage Notes (Signed)
Pt is wheezing, shob. Pt states began yesterday, history of asthma. Pt is having upper back pain, strong cough noted. Pt denies fever. Pt speaking in short sentences.

## 2020-06-27 NOTE — ED Notes (Signed)
Patient transported to X-ray 

## 2020-09-27 ENCOUNTER — Encounter: Payer: Self-pay | Admitting: Emergency Medicine

## 2020-09-27 ENCOUNTER — Emergency Department: Payer: Medicaid - Out of State

## 2020-09-27 DIAGNOSIS — U071 COVID-19: Secondary | ICD-10-CM | POA: Insufficient documentation

## 2020-09-27 DIAGNOSIS — F17210 Nicotine dependence, cigarettes, uncomplicated: Secondary | ICD-10-CM | POA: Insufficient documentation

## 2020-09-27 DIAGNOSIS — M7918 Myalgia, other site: Secondary | ICD-10-CM | POA: Diagnosis present

## 2020-09-27 DIAGNOSIS — Z91040 Latex allergy status: Secondary | ICD-10-CM | POA: Diagnosis not present

## 2020-09-27 DIAGNOSIS — Z20822 Contact with and (suspected) exposure to covid-19: Secondary | ICD-10-CM | POA: Insufficient documentation

## 2020-09-27 MED ORDER — ACETAMINOPHEN 500 MG PO TABS
ORAL_TABLET | ORAL | Status: AC
  Filled 2020-09-27: qty 2

## 2020-09-27 MED ORDER — ACETAMINOPHEN 500 MG PO TABS
1000.0000 mg | ORAL_TABLET | Freq: Once | ORAL | Status: AC
  Administered 2020-09-27: 1000 mg via ORAL

## 2020-09-27 NOTE — ED Triage Notes (Signed)
Pt c/o fever, generalized body aches and HA x3 days. Pt last took OTC Excedrin at 1300 today with no relief. Pt noticed cough and congestion over the last day. Unknown of recent exposure to ill persons. Pt denies SOB.

## 2020-09-28 ENCOUNTER — Emergency Department
Admission: EM | Admit: 2020-09-28 | Discharge: 2020-09-28 | Disposition: A | Discharge status: Home / Self Care | Payer: Medicaid - Out of State | Attending: Emergency Medicine | Admitting: Emergency Medicine

## 2020-09-28 DIAGNOSIS — U071 COVID-19: Secondary | ICD-10-CM

## 2020-09-28 LAB — RESP PANEL BY RT-PCR (FLU A&B, COVID) ARPGX2
Influenza A by PCR: NEGATIVE
Influenza B by PCR: NEGATIVE
SARS Coronavirus 2 by RT PCR: POSITIVE — AB

## 2020-09-28 MED ORDER — LACTATED RINGERS IV BOLUS
1000.0000 mL | Freq: Once | INTRAVENOUS | Status: AC
  Administered 2020-09-28: 1000 mL via INTRAVENOUS

## 2020-09-28 MED ORDER — NIRMATRELVIR/RITONAVIR (PAXLOVID)TABLET
3.0000 | ORAL_TABLET | Freq: Two times a day (BID) | ORAL | Status: DC
  Administered 2020-09-28: 3 via ORAL
  Filled 2020-09-28: qty 30

## 2020-09-28 MED ORDER — KETOROLAC TROMETHAMINE 30 MG/ML IJ SOLN
15.0000 mg | Freq: Once | INTRAMUSCULAR | Status: AC
  Administered 2020-09-28: 15 mg via INTRAVENOUS
  Filled 2020-09-28: qty 1

## 2020-09-28 NOTE — ED Provider Notes (Signed)
[facility] Emergency Department Provider Note  ____________________________________________  Time seen: Approximately 3:15 AM  I have reviewed the triage vital signs and the nursing notes.   HISTORY  Chief Complaint Fever, Generalized Body Aches, and Headache   HPI Cynthia Thompson is a 45 y.o. female with history of chronic back pain borderline diabetes who presents for evaluation of COVID-like symptoms.  Patient has had symptoms for 3 days.  She is complaining of generalized body aches, fever, cough, congestion, generalized throbbing headache.  Unvaccinated.  No known sick contact exposures.  Denies chest pain or shortness of breath.  Denies vomiting or diarrhea.  Has been taking over-the-counter Excedrin for her symptoms.   Past Medical History:  Diagnosis Date   Borderline diabetes     There are no problems to display for this patient.   Past Surgical History:  Procedure Laterality Date   CARPAL TUNNEL RELEASE     CHOLECYSTECTOMY     HYSTERECTOMY ABDOMINAL WITH SALPINGECTOMY      Prior to Admission medications   Medication Sig Start Date End Date Taking? Authorizing Provider  albuterol (VENTOLIN HFA) 108 (90 Base) MCG/ACT inhaler Inhale 2 puffs into the lungs every 6 (six) hours as needed for wheezing or shortness of breath. 06/27/20   Nita Sickle, MD  doxycycline (VIBRAMYCIN) 100 MG capsule Take 1 capsule (100 mg total) by mouth 2 (two) times daily. 09/20/19   Faythe Ghee, PA-C    Allergies Cephalexin, Ibuprofen, and Latex  History reviewed. No pertinent family history.  Social History Social History   Tobacco Use   Smoking status: Every Day    Packs/day: 1.00    Types: Cigarettes   Smokeless tobacco: Never  Vaping Use   Vaping Use: Never used  Substance Use Topics   Alcohol use: No   Drug use: No    Review of Systems  Constitutional: + fever, chills, body aches Eyes: Negative for visual changes. ENT: Negative for  sore throat. + congestion Neck: No neck pain  Cardiovascular: Negative for chest pain. Respiratory: Negative for shortness of breath. + cough Gastrointestinal: Negative for abdominal pain, vomiting or diarrhea. Genitourinary: Negative for dysuria. Musculoskeletal: Negative for back pain. Skin: Negative for rash. Neurological: Negative for weakness or numbness. + HA Psych: No SI or HI  ____________________________________________   PHYSICAL EXAM:  VITAL SIGNS: ED Triage Vitals  Enc Vitals Group     BP 09/27/20 2126 118/83     Pulse Rate 09/27/20 2126 99     Resp 09/27/20 2126 20     Temp 09/27/20 2126 (!) 101.3 F (38.5 C)     Temp Source 09/27/20 2126 Oral     SpO2 09/27/20 2126 96 %     Weight 09/27/20 2127 165 lb (74.8 kg)     Height 09/27/20 2127 5\' 1"  (1.549 m)     Head Circumference --      Peak Flow --      Pain Score 09/28/20 0057 7     Pain Loc --      Pain Edu? --      Excl. in GC? --     Constitutional: Alert and oriented. Well appearing and in no apparent distress. HEENT:      Head: Normocephalic and atraumatic.         Eyes: Conjunctivae are normal. Sclera is non-icteric.       Mouth/Throat: Mucous membranes are moist.       Neck: Supple with no signs of  meningismus. Cardiovascular: Regular rate and rhythm. No murmurs, gallops, or rubs. 2+ symmetrical distal pulses are present in all extremities. No JVD. Respiratory: Normal respiratory effort. Lungs are clear to auscultation bilaterally.  Gastrointestinal: Soft, non tender, and non distended with positive bowel sounds. No rebound or guarding. Genitourinary: No CVA tenderness. Musculoskeletal:  No edema, cyanosis, or erythema of extremities. Neurologic: Normal speech and language. Face is symmetric. Moving all extremities. No gross focal neurologic deficits are appreciated. Skin: Skin is warm, dry and intact. No rash noted. Psychiatric: Mood and affect are normal. Speech and behavior are  normal.  ____________________________________________   LABS (all labs ordered are listed, but only abnormal results are displayed)  Labs Reviewed  RESP PANEL BY RT-PCR (FLU A&B, COVID) ARPGX2 - Abnormal; Notable for the following components:      Result Value   SARS Coronavirus 2 by RT PCR POSITIVE (*)    All other components within normal limits   ____________________________________________  EKG  none  ____________________________________________  RADIOLOGY  I have personally reviewed the images performed during this visit and I agree with the Radiologist's read.   Interpretation by Radiologist:  DG Chest 2 View  Result Date: 09/27/2020 CLINICAL DATA:  Fever, COVID-19 positive, cough and congestion EXAM: CHEST - 2 VIEW COMPARISON:  06/27/2020 FINDINGS: The heart size and mediastinal contours are within normal limits. Both lungs are clear. The visualized skeletal structures are unremarkable. IMPRESSION: No active cardiopulmonary disease. Electronically Signed   By: Sharlet Salina M.D.   On: 09/27/2020 21:48     ____________________________________________   PROCEDURES  Procedure(s) performed:yes .1-3 Lead EKG Interpretation Performed by: Nita Sickle, MD Authorized by: Nita Sickle, MD     Interpretation: non-specific     ECG rate assessment: normal     Rhythm: sinus rhythm     Ectopy: none     Conduction: normal    Critical Care performed:  None ____________________________________________   INITIAL IMPRESSION / ASSESSMENT AND PLAN / ED COURSE  45 y.o. female with history of chronic back pain borderline diabetes who presents for evaluation of COVID-like symptoms x 3 days.  Patient has no chest pain, no shortness of breath, she has normal vital signs, normal work of breathing normal sats, lungs are clear to auscultation.  COVID positive.  Chest x-ray visualized by me with no signs of COVID-pneumonia.  Patient was given IV fluids, IV Toradol and  Tylenol for symptoms.  Discussed immune system boosting, oxygen monitoring, symptom relief, quarantine, and follow-up with PCP.  Patient is interested in Paxlovid.  Risks and benefits were discussed with her.  Patient was started on it in the emergency room.  Discussed my standard return precautions for chest pain, shortness of breath or hypoxia.      _____________________________________________ Please note:  Patient was evaluated in Emergency Department today for the symptoms described in the history of present illness. Patient was evaluated in the context of the global COVID-19 pandemic, which necessitated consideration that the patient might be at risk for infection with the SARS-CoV-2 virus that causes COVID-19. Institutional protocols and algorithms that pertain to the evaluation of patients at risk for COVID-19 are in a state of rapid change based on information released by regulatory bodies including the CDC and federal and state organizations. These policies and algorithms were followed during the patient's care in the ED.  Some ED evaluations and interventions may be delayed as a result of limited staffing during the pandemic.   [address] Controlled Substance Database was reviewed by  me. ____________________________________________   FINAL CLINICAL IMPRESSION(S) / ED DIAGNOSES   Final diagnoses:  COVID-19      NEW MEDICATIONS STARTED DURING THIS VISIT:  ED Discharge Orders     None        Note:  This document was prepared using Dragon voice recognition software and may include unintentional dictation errors.    Don Perking, [place], MD 09/28/20 [phone]

## 2020-09-28 NOTE — Discharge Instructions (Addendum)
To help boost your immune system against COVID-19, please take:  - Vitamin D3 4,000 IU/day - Vitamin C 500-1,000?mg twice a day - Quercetin 250?mg twice a day - Zinc 100?mg/day - Melatonin 10?mg before bedtime (causes drowsiness) - Aspirin 325?mg/day (unless contraindicated) - Pulse Oximeter Monitoring of oxygen saturation is recommended - check your oxygen 3 times a day if less than 90% return to the ER  These medications are all over-the-counter and do not need a prescription.  QUARANTINE INSTRUCTION  Follow these instructions at home:  Protecting others To avoid spreading the illness to other people: Quarantine in your home for 5 days Household members: With no symptoms: who are vaccinated do not need to quarantine but are strongly encouraged to use masks to prevent infection.  With symptoms: should get tested and if positive should quarantine for 5 days. Wash your hands often with soap and water. If soap and water are not available, use an alcohol-based hand sanitizer. If you have not cleaned your hands, do not touch your face. Make sure that all people in your household wash their hands well and often. Cover your nose and mouth when you cough or sneeze. Throw away used tissues. Stay home if you have any cold-like or flu-like symptoms. General instructions Go to your local pharmacy and buy a pulse oximeter (this is a machine that measures your oxygen). Check your oxygen levels at least 3 times a day. If your oxygen level is 90% or less return to the emergency room immediately Take over-the-counter and prescription medicines only as told by your health care provider. If you need medication for fever take tylenol or ibuprofen Drink enough fluid to keep your urine pale yellow. Rest at home as directed by your health care provider. Do not give aspirin to a child with the flu, because of the association with Reye's syndrome. Do not use tobacco products, including cigarettes, chewing  tobacco, and e-cigarettes. If you need help quitting, ask your health care provider. Keep all follow-up visits as told by your health care provider. This is important. How is this prevented? Avoid areas where an outbreak has been reported. Avoid large groups of people. Keep a safe distance from people who are coughing and sneezing. Do not touch your face if you have not cleaned your hands. When you are around people who are sick or might be sick, wear a mask to protect yourself. Contact a health care provider if: You have symptoms of SARS (cough, fever, chest pain, shortness of breath) that are not getting better at home. You have a fever. If you have difficulty breathing go to your local ER or call 911   

## 2020-09-28 NOTE — ED Notes (Signed)
MD notified of positive covid.

## 2021-07-10 ENCOUNTER — Encounter: Payer: Self-pay | Admitting: Obstetrics and Gynecology

## 2021-07-10 ENCOUNTER — Observation Stay
Admission: EM | Admit: 2021-07-10 | Discharge: 2021-07-10 | Disposition: A | Discharge status: Home / Self Care | Payer: 59 | Attending: Obstetrics and Gynecology | Admitting: Obstetrics and Gynecology

## 2021-07-10 ENCOUNTER — Other Ambulatory Visit: Payer: Self-pay

## 2021-07-10 ENCOUNTER — Emergency Department: Payer: 59

## 2021-07-10 DIAGNOSIS — R739 Hyperglycemia, unspecified: Secondary | ICD-10-CM

## 2021-07-10 DIAGNOSIS — J45901 Unspecified asthma with (acute) exacerbation: Secondary | ICD-10-CM

## 2021-07-10 DIAGNOSIS — Z87891 Personal history of nicotine dependence: Secondary | ICD-10-CM

## 2021-07-10 DIAGNOSIS — Z20822 Contact with and (suspected) exposure to covid-19: Secondary | ICD-10-CM | POA: Diagnosis not present

## 2021-07-10 DIAGNOSIS — J9600 Acute respiratory failure, unspecified whether with hypoxia or hypercapnia: Secondary | ICD-10-CM | POA: Insufficient documentation

## 2021-07-10 DIAGNOSIS — Z720 Tobacco use: Secondary | ICD-10-CM

## 2021-07-10 DIAGNOSIS — J4520 Mild intermittent asthma, uncomplicated: Secondary | ICD-10-CM | POA: Diagnosis present

## 2021-07-10 DIAGNOSIS — R0603 Acute respiratory distress: Principal | ICD-10-CM | POA: Insufficient documentation

## 2021-07-10 DIAGNOSIS — J4551 Severe persistent asthma with (acute) exacerbation: Secondary | ICD-10-CM | POA: Insufficient documentation

## 2021-07-10 DIAGNOSIS — E669 Obesity, unspecified: Secondary | ICD-10-CM

## 2021-07-10 LAB — COMPREHENSIVE METABOLIC PANEL
ALT: 14 U/L (ref 0–44)
AST: 28 U/L (ref 15–41)
Albumin: 4.5 g/dL (ref 3.5–5.0)
Alkaline Phosphatase: 51 U/L (ref 38–126)
Anion gap: 10 (ref 5–15)
BUN: 11 mg/dL (ref 6–20)
CO2: 25 mmol/L (ref 22–32)
Calcium: 10.2 mg/dL (ref 8.9–10.3)
Chloride: 104 mmol/L (ref 98–111)
Creatinine, Ser: 0.9 mg/dL (ref 0.44–1.00)
GFR, Estimated: >60 mL/min (ref >60)
Glucose, Bld: 155 mg/dL — ABNORMAL HIGH (ref 70–99)
Potassium: 3.8 mmol/L (ref 3.5–5.1)
Sodium: 139 mmol/L (ref 135–145)
Total Bilirubin: 1.5 mg/dL — ABNORMAL HIGH (ref 0.3–1.2)
Total Protein: 8.8 g/dL — ABNORMAL HIGH (ref 6.5–8.1)

## 2021-07-10 LAB — CBC WITH DIFFERENTIAL/PLATELET
Abs Immature Granulocytes: 0.06 10*3/uL (ref 0.00–0.07)
Basophils Absolute: 0.1 10*3/uL (ref 0.0–0.1)
Basophils Relative: 1 %
Eosinophils Absolute: 0.4 10*3/uL (ref 0.0–0.5)
Eosinophils Relative: 2 %
HCT: 49.8 % — ABNORMAL HIGH (ref 36.0–46.0)
Hemoglobin: 16.6 g/dL — ABNORMAL HIGH (ref 12.0–15.0)
Immature Granulocytes: 0 %
Lymphocytes Relative: 37 %
Lymphs Abs: 5.4 10*3/uL — ABNORMAL HIGH (ref 0.7–4.0)
MCH: 28 pg (ref 26.0–34.0)
MCHC: 33.3 g/dL (ref 30.0–36.0)
MCV: 84.1 fL (ref 80.0–100.0)
Monocytes Absolute: 0.9 10*3/uL (ref 0.1–1.0)
Monocytes Relative: 6 %
Neutro Abs: 7.8 10*3/uL — ABNORMAL HIGH (ref 1.7–7.7)
Neutrophils Relative %: 54 %
Platelets: 351 10*3/uL (ref 150–400)
RBC: 5.92 MIL/uL — ABNORMAL HIGH (ref 3.87–5.11)
RDW: 14.6 % (ref 11.5–15.5)
Smear Review: NORMAL
WBC: 14.6 10*3/uL — ABNORMAL HIGH (ref 4.0–10.5)
nRBC: 0 % (ref 0.0–0.2)

## 2021-07-10 LAB — C-REACTIVE PROTEIN: CRP: 0.8 mg/dL (ref <1.0)

## 2021-07-10 LAB — BLOOD GAS, ARTERIAL
Acid-Base Excess: 2.3 mmol/L — ABNORMAL HIGH (ref 0.0–2.0)
Bicarbonate: 23.9 mmol/L (ref 20.0–28.0)
Delivery systems: POSITIVE
FIO2: 50 %
O2 Saturation: 100 %
Patient temperature: 37
pCO2 arterial: 28 mmHg — ABNORMAL LOW (ref 32–48)
pH, Arterial: 7.54 — ABNORMAL HIGH (ref 7.35–7.45)
pO2, Arterial: 215 mmHg — ABNORMAL HIGH (ref 83–108)

## 2021-07-10 LAB — SARS CORONAVIRUS 2 BY RT PCR: SARS Coronavirus 2 by RT PCR: NEGATIVE

## 2021-07-10 LAB — BRAIN NATRIURETIC PEPTIDE: B Natriuretic Peptide: 9.7 pg/mL (ref 0.0–100.0)

## 2021-07-10 LAB — PROCALCITONIN: Procalcitonin: <0.1 ng/mL

## 2021-07-10 LAB — TROPONIN I (HIGH SENSITIVITY)
Troponin I (High Sensitivity): 4 ng/L (ref <18)
Troponin I (High Sensitivity): 4 ng/L (ref <18)

## 2021-07-10 MED ORDER — IPRATROPIUM-ALBUTEROL 0.5-2.5 (3) MG/3ML IN SOLN
3.0000 mL | Freq: Once | RESPIRATORY_TRACT | Status: AC
Start: 2021-07-10 — End: 2021-07-10
  Administered 2021-07-10: 3 mL via RESPIRATORY_TRACT
  Filled 2021-07-10: qty 3

## 2021-07-10 MED ORDER — ALBUTEROL SULFATE HFA 108 (90 BASE) MCG/ACT IN AERS: 2.0000 | INHALATION_SPRAY | Freq: Four times a day (QID) | RESPIRATORY_TRACT | 2 refills | Status: DC | PRN

## 2021-07-10 MED ORDER — ACETAMINOPHEN 500 MG PO TABS
1000.0000 mg | ORAL_TABLET | Freq: Four times a day (QID) | ORAL | Status: DC | PRN
  Administered 2021-07-10: 1000 mg via ORAL
  Filled 2021-07-10: qty 2

## 2021-07-10 MED ORDER — IPRATROPIUM-ALBUTEROL 0.5-2.5 (3) MG/3ML IN SOLN
3.0000 mL | Freq: Once | RESPIRATORY_TRACT | Status: AC
  Administered 2021-07-10: 3 mL via RESPIRATORY_TRACT
  Filled 2021-07-10: qty 3

## 2021-07-10 MED ORDER — IPRATROPIUM-ALBUTEROL 0.5-2.5 (3) MG/3ML IN SOLN
3.0000 mL | Freq: Four times a day (QID) | RESPIRATORY_TRACT | Status: DC
  Administered 2021-07-10: 3 mL via RESPIRATORY_TRACT
  Filled 2021-07-10: qty 3

## 2021-07-10 MED ORDER — SPACER/AERO-HOLDING CHAMBERS DEVI: 1.0000 | 5 refills | Status: DC

## 2021-07-10 MED ORDER — RACEPINEPHRINE HCL 2.25 % IN NEBU
0.5000 mL | INHALATION_SOLUTION | Freq: Once | RESPIRATORY_TRACT | Status: AC
Start: 2021-07-10 — End: 2021-07-10
  Administered 2021-07-10: 0.5 mL via RESPIRATORY_TRACT
  Filled 2021-07-10: qty 0.5

## 2021-07-10 MED ORDER — FLUTICASONE PROPIONATE HFA 44 MCG/ACT IN AERO: 2.0000 | INHALATION_SPRAY | Freq: Two times a day (BID) | RESPIRATORY_TRACT | 2 refills | Status: DC

## 2021-07-10 MED ORDER — METHYLPREDNISOLONE SODIUM SUCC 125 MG IJ SOLR
80.0000 mg | INTRAMUSCULAR | Status: DC
Start: 2021-07-10 — End: 2021-07-10
  Administered 2021-07-10: 80 mg via INTRAVENOUS
  Filled 2021-07-10: qty 2

## 2021-07-10 MED ORDER — PREDNISONE 10 MG PO TABS: 40.0000 mg | ORAL_TABLET | Freq: Every day | ORAL | 0 refills | Status: AC

## 2021-07-10 MED ORDER — SODIUM CHLORIDE 0.9 % IV BOLUS
500.0000 mL | Freq: Once | INTRAVENOUS | Status: AC
Start: 2021-07-10 — End: 2021-07-10
  Administered 2021-07-10: 500 mL via INTRAVENOUS

## 2021-07-10 MED ORDER — ALBUTEROL SULFATE (2.5 MG/3ML) 0.083% IN NEBU: 2.5000 mg | INHALATION_SOLUTION | RESPIRATORY_TRACT | Status: DC | PRN

## 2021-07-10 MED ORDER — ONDANSETRON HCL 4 MG/2ML IJ SOLN
4.0000 mg | Freq: Once | INTRAMUSCULAR | Status: AC
Start: 2021-07-10 — End: 2021-07-10
  Administered 2021-07-10: 4 mg via INTRAVENOUS
  Filled 2021-07-10: qty 2

## 2021-10-03 ENCOUNTER — Other Ambulatory Visit: Payer: Self-pay | Admitting: Obstetrics and Gynecology

## 2021-11-25 ENCOUNTER — Other Ambulatory Visit: Payer: Self-pay | Admitting: Obstetrics and Gynecology

## 2022-05-21 ENCOUNTER — Other Ambulatory Visit: Payer: Self-pay

## 2022-05-21 ENCOUNTER — Emergency Department
Admission: EM | Admit: 2022-05-21 | Discharge: 2022-05-21 | Disposition: A | Discharge status: Home / Self Care | Payer: 59 | Attending: Emergency Medicine | Admitting: Emergency Medicine

## 2022-05-21 DIAGNOSIS — K029 Dental caries, unspecified: Secondary | ICD-10-CM | POA: Diagnosis not present

## 2022-05-21 DIAGNOSIS — K047 Periapical abscess without sinus: Secondary | ICD-10-CM

## 2022-05-21 DIAGNOSIS — J45909 Unspecified asthma, uncomplicated: Secondary | ICD-10-CM | POA: Diagnosis not present

## 2022-05-21 DIAGNOSIS — E039 Hypothyroidism, unspecified: Secondary | ICD-10-CM | POA: Insufficient documentation

## 2022-05-21 DIAGNOSIS — K0889 Other specified disorders of teeth and supporting structures: Secondary | ICD-10-CM | POA: Diagnosis present

## 2022-05-21 MED ORDER — CLINDAMYCIN HCL 300 MG PO CAPS: 300.0000 mg | ORAL_CAPSULE | Freq: Three times a day (TID) | ORAL | 0 refills | Status: DC

## 2022-05-21 MED ORDER — OXYCODONE HCL 5 MG PO TABS
5.0000 mg | ORAL_TABLET | Freq: Once | ORAL | Status: AC
  Administered 2022-05-21: 5 mg via ORAL
  Filled 2022-05-21: qty 1

## 2022-05-21 MED ORDER — CHLORHEXIDINE GLUCONATE 0.12 % MT SOLN: 15.0000 mL | Freq: Two times a day (BID) | OROMUCOSAL | 0 refills | Status: AC

## 2022-05-21 NOTE — Discharge Instructions (Signed)
Please follow-up with a dentist to soon as possible.  Please return for any new, worsening, or change in symptoms or other concerns.  You may take the antibiotics and the mouth rinse as prescribed.  It was a pleasure caring for you today.

## 2022-05-21 NOTE — ED Provider Notes (Signed)
[facility] Provider Note    Event Date/Time   First MD Initiated Contact with Patient 05/21/22 [phone]     (approximate)   History   Dental Pain   HPI  Cynthia Thompson is a 47 y.o. female with a past medical history of asthma, obesity, tobacco abuse, bipolar disorder who presents today for evaluation of dental pain.  Patient reports that she is decayed teeth in her bilateral lower teeth, and she has developed pain to her right lower teeth, and swelling this morning.  She reports that she has no trouble opening her mouth.  She has been able to handle her secretions without difficulty.  No fevers or chills.  No nuchal rigidity.  Patient Active Problem List   Diagnosis Date Noted   Severe asthma with acute exacerbation 07/10/2021   Obesity (BMI 30-39.9) 07/10/2021   Hyperglycemia 07/10/2021   Tobacco abuse 07/10/2021   Asthma with acute exacerbation 07/10/2021   History of bipolar disorder 02/26/2014   Hypothyroidism 04/28/2012          Physical Exam   Triage Vital Signs: ED Triage Vitals  Enc Vitals Group     BP 05/21/22 0818 130/69     Pulse Rate 05/21/22 0818 64     Resp 05/21/22 0818 18     Temp 05/21/22 0818 98.1 F (36.7 C)     Temp Source 05/21/22 0818 Oral     SpO2 05/21/22 0818 98 %     Weight 05/21/22 0816 190 lb 0.6 oz (86.2 kg)     Height --      Head Circumference --      Peak Flow --      Pain Score 05/21/22 0816 10     Pain Loc --      Pain Edu? --      Excl. in GC? --     Most recent vital signs: Vitals:   05/21/22 0818  BP: 130/69  Pulse: 64  Resp: 18  Temp: 98.1 F (36.7 C)  SpO2: 98%    Physical Exam Vitals and nursing note reviewed.  Constitutional:      General: Awake and alert. No acute distress.    Appearance: Normal appearance. The patient is normal weight.  HENT:     Head: Normocephalic and atraumatic.     Mouth: Mucous membranes are moist.  Diffuse dental decay bilaterally to lower teeth.  No  gingival fluctuance, there is gingival erythema noted.  Tenderness to palpation along the gingiva.  No purulent drainage.  No drooling.  No trismus.  No sublingual swelling.  No erythema to face or neck. Eyes:     General: PERRL. Normal EOMs        Right eye: No discharge.        Left eye: No discharge.     Conjunctiva/sclera: Conjunctivae normal.  Cardiovascular:     Rate and Rhythm: Normal rate and regular rhythm.     Pulses: Normal pulses.  Pulmonary:     Effort: Pulmonary effort is normal. No respiratory distress.     Breath sounds: Normal breath sounds.  Abdominal:     Abdomen is soft. There is no abdominal tenderness.  Musculoskeletal:        General: No swelling. Normal range of motion.     Cervical back: Normal range of motion and neck supple.  No nuchal rigidity Skin:    General: Skin is warm and dry.     Capillary Refill: Capillary refill takes  less than 2 seconds.     Findings: No rash.  Neurological:     Mental Status: The patient is awake and alert.      ED Results / Procedures / Treatments   Labs (all labs ordered are listed, but only abnormal results are displayed) Labs Reviewed - No data to display   EKG     RADIOLOGY     PROCEDURES:  Critical Care performed:   Procedures   MEDICATIONS ORDERED IN ED: Medications  oxyCODONE (Oxy IR/ROXICODONE) immediate release tablet 5 mg (5 mg Oral Given 05/21/22 0837)     IMPRESSION / MDM / ASSESSMENT AND PLAN / ED COURSE  I reviewed the triage vital signs and the nursing notes.   Differential diagnosis includes, but is not limited to, dental decay, dental caries, gingivitis, pulpitis, abscess.  Patient is awake and alert, hemodynamically stable and afebrile.  She is nontoxic in appearance.  Patient was evaluated in the emergency department for dental pain. Patient has tenderness to multiple teeth and diffusely poor dentition, I suspect some dental decay vs pulpitits. No gingival swelling or fluctuance  concerning for gingival abscess.  No trismus, nuchal rigidity, neck pain, hot potato voice, uvular deviation or malocclusion to suggest deep space infection. No sublingual swelling concerning for Ludwig's angina.  Patient was started on antibiotics and chlorhexidine mouth rinse.  Patient was treated symptomatically in the emergency department. Discussed care plan, return precautions, and advised close outpatient follow-up with dentist.  She was given a list of dental clinics.  Patient agrees with plan of care.   Patient's presentation is most consistent with acute illness / injury with system symptoms.      FINAL CLINICAL IMPRESSION(S) / ED DIAGNOSES   Final diagnoses:  Dental caries  Infected dental caries     Rx / DC Orders   ED Discharge Orders          Ordered    clindamycin (CLEOCIN) 300 MG capsule  3 times daily        05/21/22 0834    chlorhexidine (PERIDEX) 0.12 % solution  2 times daily        05/21/22 4098             Note:  This document was prepared using Dragon voice recognition software and may include unintentional dictation errors.   [name] 05/21/22 1191    Minna Antis, MD 05/21/22 [phone]

## 2022-05-21 NOTE — ED Triage Notes (Signed)
C/O dental pain for a while.  STates noted some swelling to right lower jaw yesterday, but today swelling worse. Pain radiating to right ear and neck.  Took generic Excedrin at [facility]

## 2022-05-22 ENCOUNTER — Encounter: Payer: Self-pay | Admitting: Radiology

## 2022-05-22 ENCOUNTER — Other Ambulatory Visit: Payer: Self-pay

## 2022-05-22 ENCOUNTER — Emergency Department
Admission: EM | Admit: 2022-05-22 | Discharge: 2022-05-22 | Disposition: A | Discharge status: Home / Self Care | Payer: 59 | Attending: Emergency Medicine | Admitting: Emergency Medicine

## 2022-05-22 ENCOUNTER — Emergency Department: Payer: 59

## 2022-05-22 DIAGNOSIS — D72829 Elevated white blood cell count, unspecified: Secondary | ICD-10-CM | POA: Diagnosis not present

## 2022-05-22 DIAGNOSIS — R220 Localized swelling, mass and lump, head: Secondary | ICD-10-CM

## 2022-05-22 DIAGNOSIS — K029 Dental caries, unspecified: Secondary | ICD-10-CM | POA: Diagnosis not present

## 2022-05-22 DIAGNOSIS — R509 Fever, unspecified: Secondary | ICD-10-CM | POA: Insufficient documentation

## 2022-05-22 LAB — BASIC METABOLIC PANEL
Anion gap: 11 (ref 5–15)
BUN: 10 mg/dL (ref 6–20)
CO2: 25 mmol/L (ref 22–32)
Calcium: 9.7 mg/dL (ref 8.9–10.3)
Chloride: 105 mmol/L (ref 98–111)
Creatinine, Ser: 0.81 mg/dL (ref 0.44–1.00)
GFR, Estimated: >60 mL/min (ref >60)
Glucose, Bld: 100 mg/dL — ABNORMAL HIGH (ref 70–99)
Potassium: 3.9 mmol/L (ref 3.5–5.1)
Sodium: 141 mmol/L (ref 135–145)

## 2022-05-22 LAB — CBC AND DIFFERENTIAL: WBC: 12.2

## 2022-05-22 LAB — CBC
HCT: 46.6 % — ABNORMAL HIGH (ref 36.0–46.0)
Hemoglobin: 15.1 g/dL — ABNORMAL HIGH (ref 12.0–15.0)
MCH: 27 pg (ref 26.0–34.0)
MCHC: 32.4 g/dL (ref 30.0–36.0)
MCV: 83.4 fL (ref 80.0–100.0)
Platelets: 284 10*3/uL (ref 150–400)
RBC: 5.59 MIL/uL — ABNORMAL HIGH (ref 3.87–5.11)
RDW: 15.2 % (ref 11.5–15.5)
WBC: 12.2 10*3/uL — ABNORMAL HIGH (ref 4.0–10.5)
nRBC: 0 % (ref 0.0–0.2)

## 2022-05-22 LAB — LACTIC ACID, PLASMA: Lactic Acid, Venous: 1.6 mmol/L (ref 0.5–1.9)

## 2022-05-22 MED ORDER — SODIUM CHLORIDE 0.9 % IV BOLUS
1000.0000 mL | Freq: Once | INTRAVENOUS | Status: AC
  Administered 2022-05-22: 1000 mL via INTRAVENOUS

## 2022-05-22 MED ORDER — IOHEXOL 300 MG/ML  SOLN
75.0000 mL | Freq: Once | INTRAMUSCULAR | Status: AC | PRN
  Administered 2022-05-22: 75 mL via INTRAVENOUS

## 2022-05-22 MED ORDER — CLINDAMYCIN HCL 150 MG PO CAPS
300.0000 mg | ORAL_CAPSULE | Freq: Once | ORAL | Status: AC
  Administered 2022-05-22: 300 mg via ORAL
  Filled 2022-05-22: qty 2

## 2022-05-22 MED ORDER — CLINDAMYCIN HCL 300 MG PO CAPS: 300.0000 mg | ORAL_CAPSULE | Freq: Three times a day (TID) | ORAL | 0 refills | Status: AC

## 2022-05-22 MED ORDER — OXYCODONE-ACETAMINOPHEN 5-325 MG PO TABS: 1.0000 | ORAL_TABLET | ORAL | 0 refills | Status: DC | PRN

## 2022-05-22 MED ORDER — OXYCODONE-ACETAMINOPHEN 5-325 MG PO TABS
1.0000 | ORAL_TABLET | Freq: Once | ORAL | Status: AC
  Administered 2022-05-22: 1 via ORAL
  Filled 2022-05-22: qty 1

## 2022-05-22 MED ORDER — MORPHINE SULFATE (PF) 4 MG/ML IV SOLN
4.0000 mg | Freq: Once | INTRAVENOUS | Status: AC
  Administered 2022-05-22: 4 mg via INTRAVENOUS
  Filled 2022-05-22: qty 1

## 2022-05-22 NOTE — ED Notes (Signed)
Was able to get a green and lavender top. Could not get the lactic draw.

## 2022-05-22 NOTE — ED Provider Notes (Signed)
[facility] Provider Note    Event Date/Time   First MD Initiated Contact with Patient 05/22/22 1406     (approximate)   History   Oral Swelling   HPI  Cynthia Thompson is a 47 y.o. female presenting to the emergency department for evaluation of facial swelling.  Patient reports a longstanding history of dental decay, yesterday morning had onset of worsening pain over her right lower teeth.  She was seen in our ER and was noted to have tenderness over multiple teeth with dental decay versus pulpitis.  No abscess noted.  She was discharged home on antibiotics and chlorhexidine mouth rinse.  She reports she was unable to fill these prescriptions yet, but this morning had significant worsening in her swelling.  She is able to manage her secretions and swallow small amounts of liquids, but does report pain with this.  No changes in her voice.  Reports subjective fever.  No chest pain or shortness of breath.    Physical Exam   Triage Vital Signs: ED Triage Vitals [05/22/22 1241]  Enc Vitals Group     BP 116/63     Pulse Rate 70     Resp 18     Temp 98.5 F (36.9 C)     Temp src      SpO2 98 %     Weight      Height      Head Circumference      Peak Flow      Pain Score 10     Pain Loc      Pain Edu?      Excl. in GC?     Most recent vital signs: Vitals:   05/22/22 1241  BP: 116/63  Pulse: 70  Resp: 18  Temp: 98.5 F (36.9 C)  SpO2: 98%     General: Awake, no distress.  HEENT: Swelling noted over the right side of the face primarily along the lower aspect of face into the submandibular region.  Patient is able to open her mouth.  There are multiple decayed teeth over the right lower molars with tap tenderness to palpation over multiple teeth.  Somewhat limited exam due to patient discomfort.  No significant swelling underneath the tongue. CV:  Regular rate and rhythm, normal peripheral perfusion Resp:  Normal effort, lungs clear to  auscultation Abd:  No distention.    ED Results / Procedures / Treatments   Labs (all labs ordered are listed, but only abnormal results are displayed) Labs Reviewed  CBC - Abnormal; Notable for the following components:      Result Value   WBC 12.2 (*)    RBC 5.59 (*)    Hemoglobin 15.1 (*)    HCT 46.6 (*)    All other components within normal limits  BASIC METABOLIC PANEL - Abnormal; Notable for the following components:   Glucose, Bld 100 (*)    All other components within normal limits  LACTIC ACID, PLASMA  LACTIC ACID, PLASMA    RADIOLOGY CT soft tissue neck pending at time of signout    PROCEDURES:  Critical Care performed: No  Procedures   MEDICATIONS ORDERED IN ED: Medications  morphine (PF) 4 MG/ML injection 4 mg (has no administration in time range)  sodium chloride 0.9 % bolus 1,000 mL (has no administration in time range)     IMPRESSION / MDM / ASSESSMENT AND PLAN / ED COURSE  I reviewed the triage vital signs and the  nursing notes.  Differential diagnosis includes, but is not limited to odontogenic infection with associated swelling, facial abscess, sialolithiasis  Patient's presentation is most consistent with acute presentation with potential threat to life or bodily function.  48 year old female presenting with worsening facial swelling and lower right dental pain.  Able to open mouth and managing secretions, but does have significant swelling on exam.  Lab work with mild leukocytosis WBC of 12.2, possible hemoconcentration with a hemoglobin of 15.1.  BMP without severe derangement.  Will order IV fluids, pain medication, CT soft tissue neck for further evaluation.  A 1520, care of this patient was signed out to oncoming provider pending CT, reevaluation, and disposition.        FINAL CLINICAL IMPRESSION(S) / ED DIAGNOSES   Final diagnoses:  Right facial swelling     Rx / DC Orders   ED Discharge Orders     None        Note:   This document was prepared using Dragon voice recognition software and may include unintentional dictation errors.   Trinna Post, MD 05/22/22 1524

## 2022-05-22 NOTE — ED Provider Notes (Signed)
-----------------------------------------   5:10 PM on 05/22/2022 ----------------------------------------- Patient care assumed from prior provider.  CT scans resulted showing a small 1.7 x 0.7 cm area of fluid.  I have seen and evaluated the patient she does have mild swelling to the right face however there is not any parent drainable abscess from within the mouth.  I discussed with the patient the importance of filling her antibiotic and begin taking it as well as following up with a dentist as soon as possible preferably tomorrow.  I also discussed very strict return precautions if the swelling worsens or she develops a fever she should return immediately to the emergency department.  Patient agreeable to plan.  Will place the patient on clindamycin.  Will prescribe short course of pain medication as well.  Patient states she will call dentist options first thing in the morning.   Minna Antis, MD 05/22/22 1711

## 2022-05-22 NOTE — ED Triage Notes (Signed)
Pt comes with c/o right sided facial pain and swelling. Pt has abscess. Pt was seen here yesterday and prescribed meds. Pt states she didn't get the script filled.  Pt states she feels worse now and pain all down her neck.

## 2022-05-22 NOTE — Discharge Instructions (Signed)
OPTIONS FOR DENTAL FOLLOW UP CARE ° °[place] [facility] - Local Safety Net Dental Clinics °http://www.ncdhhs.gov/dph/oralhealth/services/safetynetclinics.htm °  °[facility] (336-562-3123) ° °[facility] [place] (919-933-9087) ° °[facility] ([phone] ext [phone]) ° °[facility] ([phone]) ° °[facility] ([phone]) °This clinic caters to the indigent population and is on a lottery system. °Location: °[facility], [address], [place] °Clinic Hours: °Wednesdays from 6pm - 9pm, patients seen by a lottery system. °For dates, call or go to [email or web address]/[facility]/[email or web address] °Services: °Cleanings, fillings and simple extractions. °Payment Options: °DENTAL WORK IS FREE OF CHARGE. Bring proof of income or support. °Best way to get seen: °Arrive at 5:15 pm - this is a lottery, NOT first come/first serve, so arriving earlier will not increase your chances of being seen. °  °  °[facility] °[phone] °Select option 1 for emergencies °  °Location: °[facility], [address], [place] °Clinic Hours: °No walk-ins accepted - call the day before to schedule an appointment. °Check in times are 9:30 am and 1:30 pm. °Services: °Simple extractions, temporary fillings, pulpectomy/pulp debridement, uncomplicated abscess drainage. °Payment Options: °PAYMENT IS DUE AT THE TIME OF SERVICE.  Fee is usually $100-200, additional surgical procedures (e.g. abscess drainage) may be extra. °Cash, checks, Visa/MasterCard accepted.  Can file Medicaid if patient is covered for dental - patient should call case worker to check. °No discount for [facility] patients. °Best way to get seen: °MUST call the day before and get onto the schedule. Can usually be seen the next 1-2 days. No walk-ins accepted. °  °  °[facility] °[phone] °   °Location: °[facility], [address], [place] °Clinic Hours: °M, W, Th, F 8am or 1:30pm, Tues 9a or 1:30 - first come/first served. °Services: °Simple extractions, temporary fillings, uncomplicated abscess drainage.  You do not need to be an [place] resident. °Payment Options: °PAYMENT IS DUE AT THE TIME OF SERVICE. °Dental insurance, otherwise sliding scale - bring proof of income or support. °Depending on income and treatment needed, cost is usually $50-200. °Best way to get seen: °Arrive early as it is first come/first served. °  °  °[facility] °[phone] °  °Location: °[address] °Clinic Hours: °Mon-Thu 8a-5p °Services: °Most basic dental services including extractions and fillings. °Payment Options: °PAYMENT IS DUE AT THE TIME OF SERVICE. °Sliding scale, up to 50% off - bring proof if income or support. °Medicaid with dental option accepted. °Best way to get seen: °Call to schedule an appointment, can usually be seen within 2 weeks OR they will try to see walk-ins - show up at 8a or 2p (you may have to wait). °  °  °[facility] °919-245-2435 °[place] RESIDENTS ONLY °  °Location: °[facility], [address], [place], [place] [address] °Clinic Hours: By appointment only. °Monday - Thursday 8am-5pm, Friday 8am-12pm °Services: Cleanings, fillings, extractions. °Payment Options: °PAYMENT IS DUE AT THE TIME OF SERVICE. °Cash, Visa or MasterCard. Sliding scale - $30 minimum per service. °Best way to get seen: °Come in to office, complete packet and make an appointment - need proof of income °or support monies for each household member and proof of [place] residence. °Usually takes about a month to get in. °  °  °[facility] °[phone] °  °Location: °[address]   [place] °Clinic Hours: Walk-in Urgent Care Dental Services are offered Monday-Friday  mornings only. °The numbers of emergencies accepted daily is limited to the number of °providers available. °Maximum 15 - Mondays, Wednesdays & Thursdays °Maximum 10 - Tuesdays & Fridays °Services: °You do not need to be a [place] resident to be seen for a dental emergency. °Emergencies are defined as pain, swelling, abnormal bleeding, or dental trauma. Walkins will receive x-rays if needed. °NOTE: Dental cleaning is not an emergency. °Payment Options: °PAYMENT IS DUE AT THE TIME OF SERVICE. °Minimum co-pay is $40.00 for uninsured patients. °Minimum co-pay is $3.00 for Medicaid with dental coverage. °Dental Insurance is accepted and must be presented at time of visit. °Medicare does not cover dental. °Forms of payment: Cash, credit card, checks. °Best way to get seen: °If not previously registered with the clinic, walk-in dental registration begins at 7:15 am and is on a first come/first serve basis. °If previously registered with the clinic, call to make an appointment. °  °  °[facility] °[phone] °[place] RESIDENTS ONLY °  °Location: °[address], [place], [place] °Clinic Hours: °Mon-Thu 10a-2p °Services: Extractions only! °Payment Options: °FREE (donations accepted) - bring proof of income or support °Best way to get seen: °Call and schedule an appointment OR come at 8am on the 1st Monday of every month (except for holidays) when it is first come/first served. °  °  °[facility] °[phone] °  °Location: °[address], [place] °Clinic Hours: °Friday mornings °Services, Payment Options, Best way to get seen: °Call for info °

## 2022-07-29 IMAGING — CR DG CHEST 2V
1 series · 2 of 2 positions shown · non-contrast
Comparison: 06/27/2020

CLINICAL DATA: Fever, [id number] positive, cough and congestion

EXAM:
CHEST - 2 VIEW

[Series 1: dg chest 2 view · 0.14mm/px · 2 of 2 slices shown]
[im 1/2]
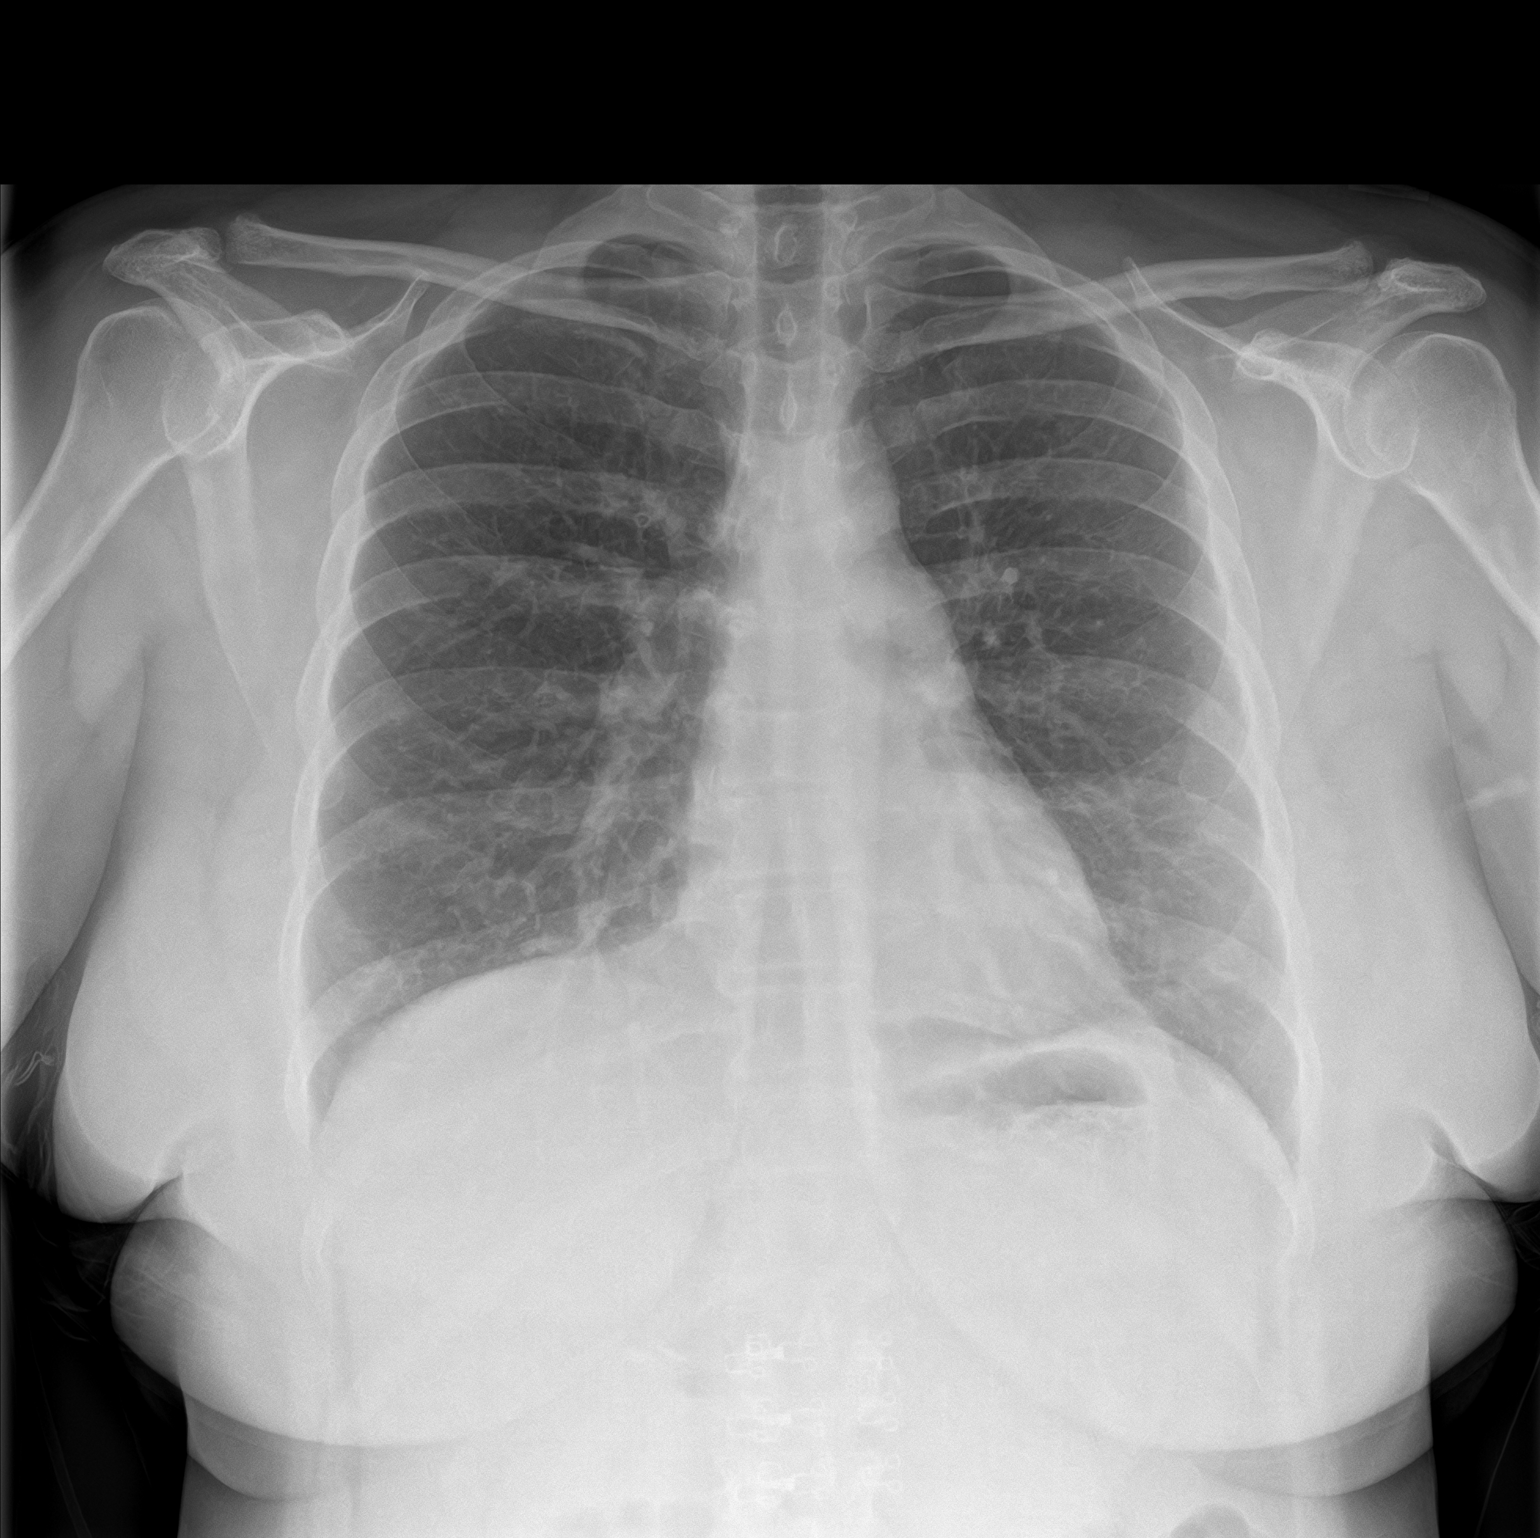
[im 2/2]
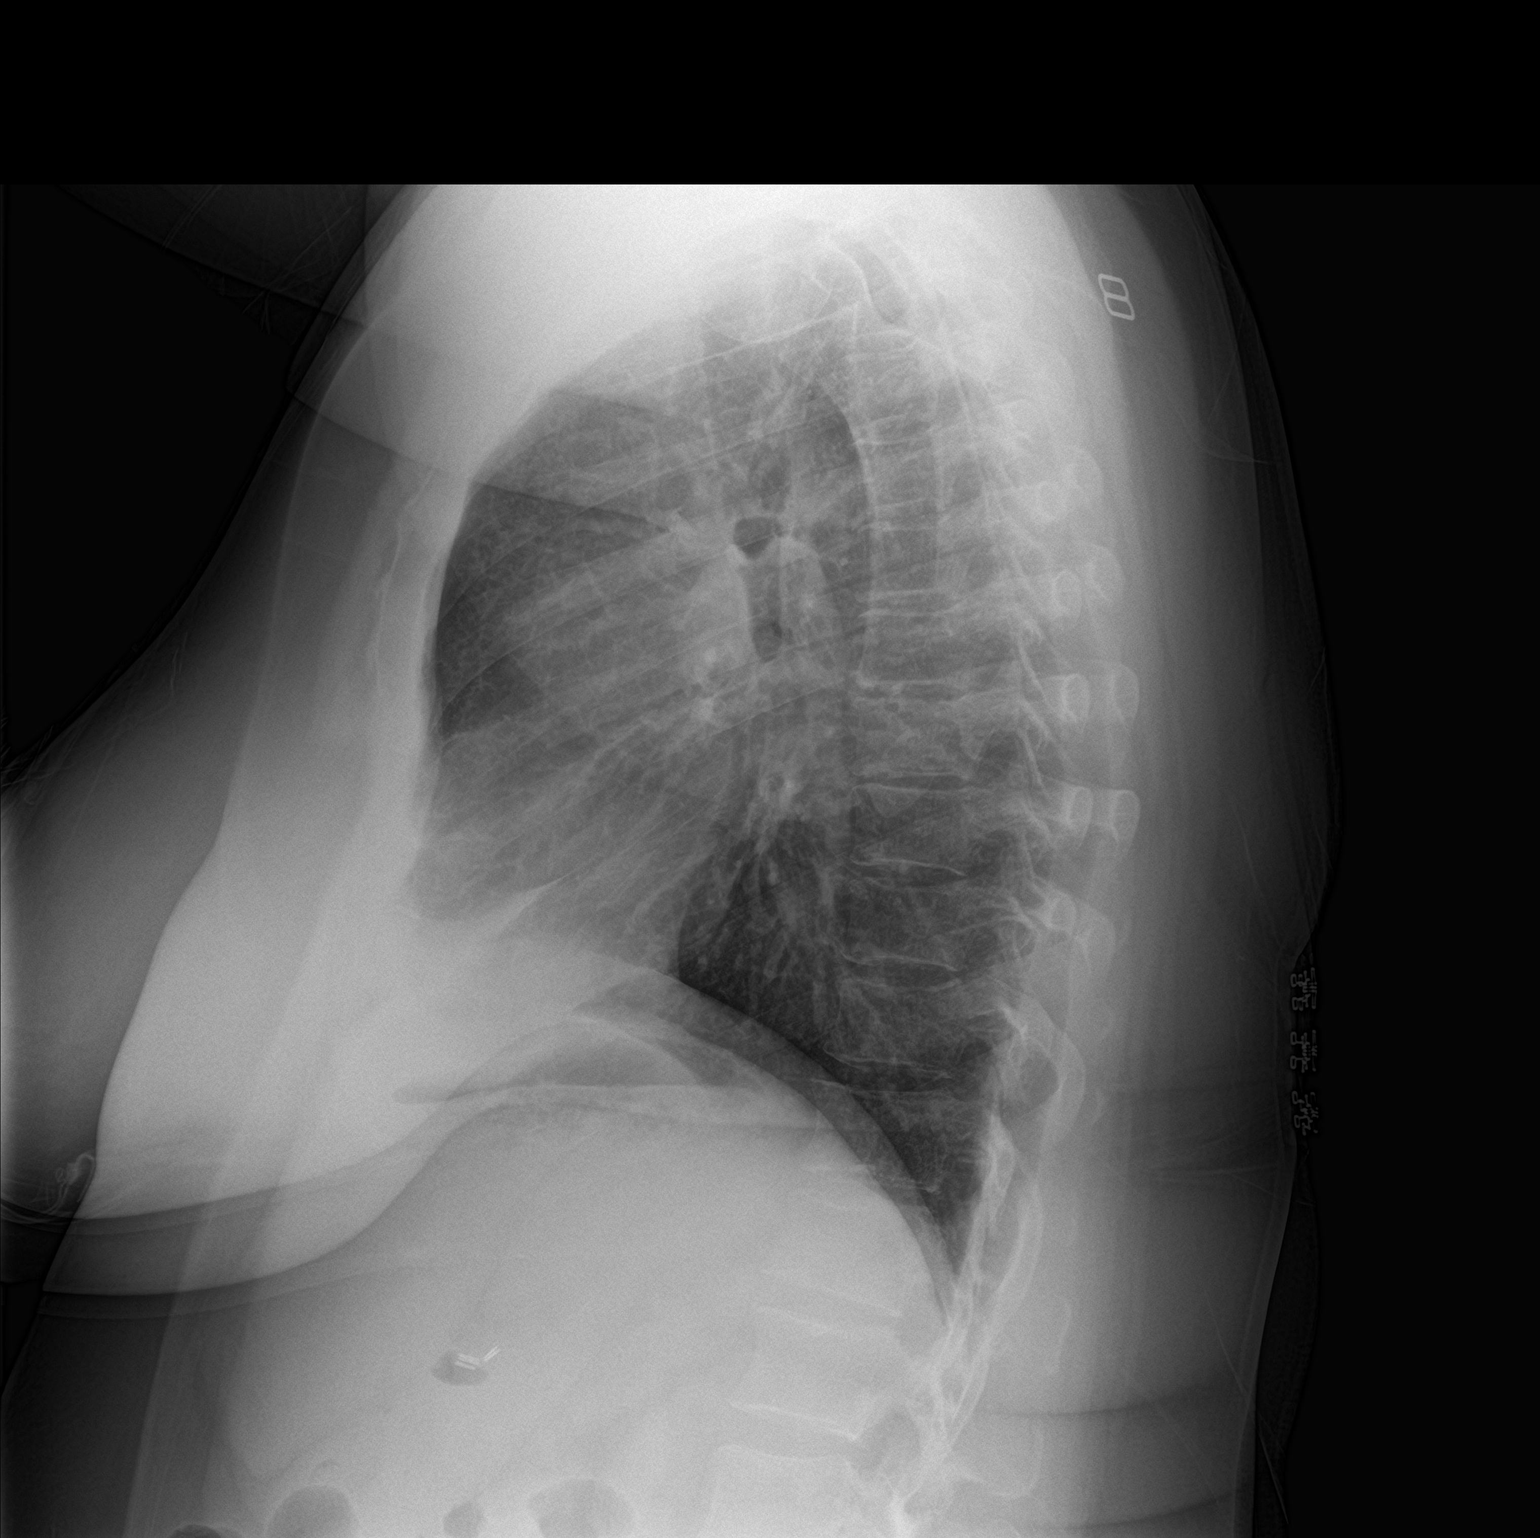

[2 of 2 positions shown; findings below may reference images not displayed]

FINDINGS: The heart size and mediastinal contours are within normal limits.
Both lungs are clear. The visualized skeletal structures are
unremarkable.
IMPRESSION: No active cardiopulmonary disease.

## 2022-09-10 ENCOUNTER — Ambulatory Visit (INDEPENDENT_AMBULATORY_CARE_PROVIDER_SITE_OTHER): Payer: 59 | Admitting: Physician Assistant

## 2022-09-10 ENCOUNTER — Encounter: Payer: Self-pay | Admitting: Physician Assistant

## 2022-09-10 VITALS — BP 104/76 | HR 51 | Temp 98.0°F | Ht 61.0 in | Wt 181.0 lb

## 2022-09-10 DIAGNOSIS — G8929 Other chronic pain: Secondary | ICD-10-CM

## 2022-09-10 DIAGNOSIS — K219 Gastro-esophageal reflux disease without esophagitis: Secondary | ICD-10-CM

## 2022-09-10 DIAGNOSIS — M545 Low back pain, unspecified: Secondary | ICD-10-CM | POA: Diagnosis not present

## 2022-09-10 DIAGNOSIS — Z1159 Encounter for screening for other viral diseases: Secondary | ICD-10-CM

## 2022-09-10 DIAGNOSIS — E039 Hypothyroidism, unspecified: Secondary | ICD-10-CM

## 2022-09-10 DIAGNOSIS — D72829 Elevated white blood cell count, unspecified: Secondary | ICD-10-CM

## 2022-09-10 DIAGNOSIS — F331 Major depressive disorder, recurrent, moderate: Secondary | ICD-10-CM

## 2022-09-10 DIAGNOSIS — Z1321 Encounter for screening for nutritional disorder: Secondary | ICD-10-CM

## 2022-09-10 DIAGNOSIS — J4520 Mild intermittent asthma, uncomplicated: Secondary | ICD-10-CM

## 2022-09-10 DIAGNOSIS — Z13220 Encounter for screening for lipoid disorders: Secondary | ICD-10-CM

## 2022-09-10 DIAGNOSIS — Z114 Encounter for screening for human immunodeficiency virus [HIV]: Secondary | ICD-10-CM

## 2022-09-10 MED ORDER — AMITRIPTYLINE HCL 25 MG PO TABS
25.0000 mg | ORAL_TABLET | Freq: Every day | ORAL | 1 refills | Status: AC
Start: 2022-09-10 — End: ?

## 2022-09-10 MED ORDER — PANTOPRAZOLE SODIUM 40 MG PO TBEC
40.0000 mg | DELAYED_RELEASE_TABLET | Freq: Every day | ORAL | 1 refills | Status: AC
Start: 2022-09-10 — End: ?

## 2022-09-10 MED ORDER — ALBUTEROL SULFATE HFA 108 (90 BASE) MCG/ACT IN AERS
2.0000 | INHALATION_SPRAY | Freq: Four times a day (QID) | RESPIRATORY_TRACT | 11 refills | Status: AC | PRN
Start: 2022-09-10 — End: ?

## 2022-09-10 NOTE — Patient Instructions (Signed)
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
## 2022-09-11 ENCOUNTER — Other Ambulatory Visit: Payer: Self-pay | Admitting: Physician Assistant

## 2022-09-11 DIAGNOSIS — E038 Other specified hypothyroidism: Secondary | ICD-10-CM

## 2022-09-11 LAB — COMPREHENSIVE METABOLIC PANEL
ALT: 15 IU/L (ref 0–32)
AST: 20 IU/L (ref 0–40)
Albumin: 4.1 g/dL (ref 3.9–4.9)
Alkaline Phosphatase: 62 IU/L (ref 44–121)
BUN/Creatinine Ratio: 28 — ABNORMAL HIGH (ref 9–23)
BUN: 18 mg/dL (ref 6–24)
Bilirubin Total: 0.2 mg/dL (ref 0.0–1.2)
CO2: 25 mmol/L (ref 20–29)
Calcium: 9.7 mg/dL (ref 8.7–10.2)
Chloride: 105 mmol/L (ref 96–106)
Creatinine, Ser: 0.65 mg/dL (ref 0.57–1.00)
Globulin, Total: 2.9 g/dL (ref 1.5–4.5)
Glucose: 84 mg/dL (ref 70–99)
Potassium: 4.7 mmol/L (ref 3.5–5.2)
Sodium: 141 mmol/L (ref 134–144)
Total Protein: 7 g/dL (ref 6.0–8.5)
eGFR: 109 mL/min/{1.73_m2} (ref >59)

## 2022-09-11 LAB — HEPATITIS C ANTIBODY: Hep C Virus Ab: NONREACTIVE

## 2022-09-11 LAB — LIPID PANEL
Chol/HDL Ratio: 3.2 ratio (ref 0.0–4.4)
Cholesterol, Total: 165 mg/dL (ref 100–199)
HDL: 51 mg/dL (ref >39)
LDL Chol Calc (NIH): 82 mg/dL (ref 0–99)
Triglycerides: 188 mg/dL — ABNORMAL HIGH (ref 0–149)
VLDL Cholesterol Cal: 32 mg/dL (ref 5–40)

## 2022-09-11 LAB — TSH+FREE T4
Free T4: 0.84 ng/dL (ref 0.82–1.77)
TSH: 13.2 u[IU]/mL — ABNORMAL HIGH (ref 0.450–4.500)

## 2022-09-11 LAB — CBC WITH DIFFERENTIAL/PLATELET
Basophils Absolute: 0.1 10*3/uL (ref 0.0–0.2)
Basos: 1 %
EOS (ABSOLUTE): 0.3 10*3/uL (ref 0.0–0.4)
Eos: 3 %
Hematocrit: 41 % (ref 34.0–46.6)
Hemoglobin: 13.6 g/dL (ref 11.1–15.9)
Immature Grans (Abs): 0 10*3/uL (ref 0.0–0.1)
Immature Granulocytes: 0 %
Lymphocytes Absolute: 3.8 10*3/uL — ABNORMAL HIGH (ref 0.7–3.1)
Lymphs: 40 %
MCH: 27.9 pg (ref 26.6–33.0)
MCHC: 33.2 g/dL (ref 31.5–35.7)
MCV: 84 fL (ref 79–97)
Monocytes Absolute: 0.7 10*3/uL (ref 0.1–0.9)
Monocytes: 7 %
Neutrophils Absolute: 4.5 10*3/uL (ref 1.4–7.0)
Neutrophils: 49 %
Platelets: 270 10*3/uL (ref 150–450)
RBC: 4.87 x10E6/uL (ref 3.77–5.28)
RDW: 14.3 % (ref 11.7–15.4)
WBC: 9.4 10*3/uL (ref 3.4–10.8)

## 2022-09-11 LAB — HIV ANTIBODY (ROUTINE TESTING W REFLEX): HIV Screen 4th Generation wRfx: NONREACTIVE

## 2022-09-11 LAB — VITAMIN D 25 HYDROXY (VIT D DEFICIENCY, FRACTURES): Vit D, 25-Hydroxy: 43 ng/mL (ref 30.0–100.0)

## 2022-09-11 MED ORDER — LEVOTHYROXINE SODIUM 50 MCG PO TABS
50.0000 ug | ORAL_TABLET | Freq: Every day | ORAL | 1 refills | Status: AC
Start: 2022-09-11 — End: ?

## 2022-10-23 ENCOUNTER — Ambulatory Visit: Payer: Medicaid Other | Admitting: Physician Assistant

## 2022-10-23 ENCOUNTER — Telehealth: Payer: Self-pay | Admitting: Physician Assistant

## 2022-10-23 NOTE — Telephone Encounter (Signed)
Sent patient message to reschedule appointment with Alvester Morin

## 2022-10-26 DIAGNOSIS — G8929 Other chronic pain: Secondary | ICD-10-CM | POA: Insufficient documentation

## 2022-10-26 DIAGNOSIS — M545 Low back pain, unspecified: Secondary | ICD-10-CM | POA: Insufficient documentation

## 2022-10-29 ENCOUNTER — Ambulatory Visit: Payer: 59 | Admitting: Physician Assistant

## 2022-10-29 ENCOUNTER — Telehealth: Payer: Self-pay | Admitting: Physician Assistant

## 2022-10-29 DIAGNOSIS — G8929 Other chronic pain: Secondary | ICD-10-CM

## 2022-10-29 NOTE — Telephone Encounter (Signed)
Sent message to re-schedule.

## 2022-12-17 ENCOUNTER — Other Ambulatory Visit: Payer: Self-pay

## 2022-12-17 ENCOUNTER — Encounter (HOSPITAL_COMMUNITY): Payer: Self-pay | Admitting: *Deleted

## 2022-12-17 ENCOUNTER — Emergency Department (HOSPITAL_COMMUNITY): Payer: 59

## 2022-12-17 ENCOUNTER — Emergency Department (HOSPITAL_COMMUNITY)
Admission: EM | Admit: 2022-12-17 | Discharge: 2022-12-17 | Discharge status: Against Medical Advice | Payer: 59 | Attending: Emergency Medicine | Admitting: Emergency Medicine

## 2022-12-17 DIAGNOSIS — J45909 Unspecified asthma, uncomplicated: Secondary | ICD-10-CM | POA: Diagnosis not present

## 2022-12-17 DIAGNOSIS — R059 Cough, unspecified: Secondary | ICD-10-CM | POA: Diagnosis present

## 2022-12-17 DIAGNOSIS — Z5321 Procedure and treatment not carried out due to patient leaving prior to being seen by health care provider: Secondary | ICD-10-CM | POA: Insufficient documentation

## 2022-12-17 NOTE — ED Triage Notes (Signed)
Pt with asthma, coughing that is dry, and SOB with exertion for past 2 weeks.  Pt has been using her inhalers.

## 2022-12-31 NOTE — Progress Notes (Signed)
 Holding off on ABG per MD.

## 2022-12-31 NOTE — ED Notes (Signed)
 ED Procedure Note  EKG Interpretation  Date/Time: 12/31/2022 8:57 PM  Performed by: McPherson Norleen Agent, MD Authorized by: Eleanor Norleen Agent, MD   ECG interpreted by ED Physician in the absence of a cardiologist: yes   Previous ECG:    Previous ECG:  Unavailable Interpretation:    Interpretation: normal   Rate:    ECG rate:  58   ECG rate assessment: normal   Rhythm:    Rhythm: sinus rhythm   Ectopy:    Ectopy: none   QRS:    QRS axis:  Normal   QRS intervals:  Normal   QRS conduction: normal   ST segments:    ST segments:  Normal T waves:    T waves: normal   Q waves:    Abnormal Q-waves: not present   Comments:     Sinus bradycardia 58 no acute ST-T wave changes normal intervals

## 2023-01-02 NOTE — ED Notes (Signed)
 Results faxed to PCP

## 2023-09-30 ENCOUNTER — Emergency Department (HOSPITAL_COMMUNITY): Payer: Self-pay

## 2023-09-30 ENCOUNTER — Other Ambulatory Visit: Payer: Self-pay

## 2023-09-30 ENCOUNTER — Emergency Department (HOSPITAL_COMMUNITY)
Admission: EM | Admit: 2023-09-30 | Discharge: 2023-09-30 | Disposition: A | Discharge status: Home / Self Care | Payer: Self-pay | Attending: Emergency Medicine | Admitting: Emergency Medicine

## 2023-09-30 ENCOUNTER — Encounter (HOSPITAL_COMMUNITY): Payer: Self-pay | Admitting: Emergency Medicine

## 2023-09-30 DIAGNOSIS — M546 Pain in thoracic spine: Secondary | ICD-10-CM | POA: Insufficient documentation

## 2023-09-30 DIAGNOSIS — J45909 Unspecified asthma, uncomplicated: Secondary | ICD-10-CM | POA: Insufficient documentation

## 2023-09-30 DIAGNOSIS — G8929 Other chronic pain: Secondary | ICD-10-CM

## 2023-09-30 DIAGNOSIS — F17210 Nicotine dependence, cigarettes, uncomplicated: Secondary | ICD-10-CM | POA: Insufficient documentation

## 2023-09-30 DIAGNOSIS — Z8541 Personal history of malignant neoplasm of cervix uteri: Secondary | ICD-10-CM | POA: Insufficient documentation

## 2023-09-30 DIAGNOSIS — M545 Low back pain, unspecified: Secondary | ICD-10-CM | POA: Insufficient documentation

## 2023-09-30 DIAGNOSIS — Z91040 Latex allergy status: Secondary | ICD-10-CM | POA: Insufficient documentation

## 2023-09-30 MED ORDER — KETOROLAC TROMETHAMINE 15 MG/ML IJ SOLN
15.0000 mg | Freq: Once | INTRAMUSCULAR | Status: AC
  Administered 2023-09-30: 15 mg via INTRAMUSCULAR
  Filled 2023-09-30: qty 1

## 2023-09-30 MED ORDER — METHOCARBAMOL 500 MG PO TABS: 500.0000 mg | ORAL_TABLET | Freq: Two times a day (BID) | ORAL | 0 refills | Status: AC

## 2023-09-30 MED ORDER — DIAZEPAM 5 MG/ML IJ SOLN
5.0000 mg | Freq: Once | INTRAMUSCULAR | Status: AC
  Administered 2023-09-30: 5 mg via INTRAMUSCULAR
  Filled 2023-09-30: qty 2

## 2023-09-30 NOTE — Discharge Instructions (Addendum)
 We evaluated you for your back pain.  Your examination and x-rays are reassuring.  We do not think that your pain is due to any dangerous condition like a spinal cord injury.  We suspect that you may have a muscle strain or disc herniation.  We believe it is safe to go home at this time.  Please follow-up closely with your primary doctor.  If you do not have a primary doctor you can follow-up with [facility].  You can also follow-up with a spine doctor.  You can follow-up with Dr. Joshua.  Please return if you develop any new or worsening symptoms such as trouble walking, incontinence, fevers or chills, increased weakness in your legs, or any other concerning symptoms.

## 2023-09-30 NOTE — ED Provider Notes (Signed)
 [place]  EMERGENCY DEPARTMENT AT [facility] Provider Note  CSN: [id number] Arrival date & time: 09/30/23 1144  Chief Complaint(s) Back Pain  HPI SHELBE Cynthia Thompson is a 48 y.o. female chronic back pain presenting to the emergency department with back pain.  She reports that she developed the back pain to mid thoracic region into the lumbar region.  Has had prior pain in these areas before but is worse today.  Reports the pain radiates to right leg.  Triage note reports numbness down both legs but to me patient reports that this mainly the right leg.  Symptoms began after lifting object at work, patient reports she works in a [employer].  [place], was previously seeing chronic pain and spine doctor for similar pain.  Denies any chest pain or shortness of breath, abdominal pain, trouble walking, numbness in the left leg.  Denies any history of IV drug use.  No fevers or chills.  No syncope.  Pain is not pleuritic.   Past Medical History Past Medical History:  Diagnosis Date   Anxiety    Asthma    Back pain    Borderline diabetes    Cervical cancer (HCC)    Depression    Restless leg    Thyroid disease    Patient Active Problem List   Diagnosis Date Noted   Chronic low back pain 10/26/2022   Moderate recurrent major depression (HCC) 09/10/2022   Gastroesophageal reflux disease 09/10/2022   Asthma, intermittent 07/10/2021   Obesity (BMI 30-39.9) 07/10/2021   Hyperglycemia 07/10/2021   Asthma with acute exacerbation 07/10/2021   Nephrolithiasis 05/16/2019   Anxiety 11/21/2018   History of tobacco use 08/11/2014   Carpal tunnel syndrome, right 06/03/2014   History of bipolar disorder 02/26/2014   S/P laparoscopic cholecystectomy 10/12/2013   DDD (degenerative disc disease), lumbar 09/10/2013   Fibromyalgia 09/10/2013   Subclinical hypothyroidism 04/28/2012   Hx of cervical cancer 08/03/2011   Home Medication(s) Prior to Admission medications    Medication Sig Start Date End Date Taking? Authorizing Provider  methocarbamol  (ROBAXIN ) 500 MG tablet Take 1 tablet (500 mg total) by mouth 2 (two) times daily. 09/30/23  Yes Francesca Elsie CROME, MD  albuterol  (VENTOLIN  HFA) 108 (90 Base) MCG/ACT inhaler Inhale 2 puffs into the lungs every 6 (six) hours as needed for wheezing or shortness of breath. 09/10/22   Manya Toribio SQUIBB, PA  amitriptyline  (ELAVIL ) 25 MG tablet Take 1 tablet (25 mg total) by mouth at bedtime. 09/10/22   Manya Toribio SQUIBB, PA  chlorhexidine  (PERIDEX ) 0.12 % solution Use as directed 15 mLs in the mouth or throat 2 (two) times daily. 05/21/22   Poggi, Jenna E, PA-C  levothyroxine  (SYNTHROID ) 50 MCG tablet Take 1 tablet (50 mcg total) by mouth daily. Take on empty stomach, first thing in the morning, full glass of water, 30 min before anything else 09/11/22   Manya Toribio SQUIBB, PA  pantoprazole  (PROTONIX ) 40 MG tablet Take 1 tablet (40 mg total) by mouth daily. 09/10/22   Manya Toribio SQUIBB, PA  Past Surgical History Past Surgical History:  Procedure Laterality Date   CARPAL TUNNEL RELEASE     CHOLECYSTECTOMY     HYSTERECTOMY ABDOMINAL WITH SALPINGECTOMY     Family History Family History  Problem Relation Age of Onset   Skin cancer Mother    Stomach cancer Father    Heart attack Brother     Social History Social History   Tobacco Use   Smoking status: Every Day    Current packs/day: 1.00    Average packs/day: 1 pack/day for 35.7 years (35.7 ttl pk-yrs)    Types: Cigarettes    Start date: 28   Smokeless tobacco: Never  Vaping Use   Vaping status: Never Used  Substance Use Topics   Alcohol use: No   Drug use: No   Allergies Ibuprofen, Cephalexin, and Latex  Review of Systems Review of Systems  All other systems reviewed and are negative.   Physical Exam Vital Signs  I have  reviewed the triage vital signs BP 136/88 (BP Location: Right Arm)   Pulse (!) 53   Temp 98.3 F (36.8 C) (Oral)   Resp 16   Ht 5' 1 (1.549 m)   Wt 79.4 kg   SpO2 100%   BMI 33.07 kg/m  Physical Exam Vitals and nursing note reviewed.  Constitutional:      General: She is not in acute distress.    Appearance: She is well-developed.  HENT:     Head: Normocephalic and atraumatic.     Mouth/Throat:     Mouth: Mucous membranes are moist.  Eyes:     Pupils: Pupils are equal, round, and reactive to light.  Cardiovascular:     Rate and Rhythm: Normal rate and regular rhythm.     Pulses:          Radial pulses are 2+ on the right side and 2+ on the left side.     Heart sounds: No murmur heard. Pulmonary:     Effort: Pulmonary effort is normal. No respiratory distress.     Breath sounds: Normal breath sounds.  Abdominal:     General: Abdomen is flat.     Palpations: Abdomen is soft.     Tenderness: There is no abdominal tenderness.  Musculoskeletal:        General: No tenderness.     Right lower leg: No edema.     Left lower leg: No edema.     Comments: Mild tenderness around the mid thoracic spine and lower lumbar spine with no step-off.  Mild paraspinal tenderness around these areas also  Skin:    General: Skin is warm and dry.  Neurological:     General: No focal deficit present.     Mental Status: She is alert. Mental status is at baseline.     Comments: Strength 5/5 in the bilateral upper and lower extremities, no sensory deficits, ambulatory   Psychiatric:        Mood and Affect: Mood normal.        Behavior: Behavior normal.     ED Results and Treatments Labs (all labs ordered are listed, but only abnormal results are displayed) Labs Reviewed - No data to display  Radiology DG Lumbar Spine Complete Result Date: 09/30/2023 CLINICAL DATA:   Worsening chronic back pain. No numbness running down legs. EXAM: LUMBAR SPINE - COMPLETE 4+ VIEW COMPARISON:  None Available. FINDINGS: Five non-rib-bearing lumbar vertebra. Straightening of normal lordosis. Limbic vertebra at L3. Vertebral body heights are normal. No evidence of acute fracture. Mild multilevel spondylosis with anterior spurring and slight L5-S1 disc space narrowing. There is L5-S1 facet hypertrophy. No obvious pars defects or focal bone abnormality. Sacroiliac joints are congruent. Surgical clips in the right upper quadrant. IMPRESSION: 1. Mild multilevel spondylosis with slight L5-S1 disc space narrowing. 2. L5-S1 facet hypertrophy. Electronically Signed   By: Andrea Gasman M.D.   On: 09/30/2023 15:28   DG Chest 2 View Result Date: 09/30/2023 CLINICAL DATA:  thoracic back pain EXAM: CHEST - 2 VIEW COMPARISON:  December 17, 2022 FINDINGS: Linear radiopaque structure overlying the midthoracic spine on the lateral radiograph. No focal airspace consolidation, pleural effusion, or pneumothorax. No cardiomegaly.No acute fracture or destructive lesion. Multilevel thoracic osteophytosis. IMPRESSION: 1. No acute cardiopulmonary abnormality. 2. Linear radiopaque structure overlying the midthoracic spine on the lateral radiograph, without corresponding structure on the frontal radiograph, likely external to the patient. Correlation with physical exam and clinical findings recommended to exclude possibility of a radiopaque foreign body. Electronically Signed   By: Rogelia Myers M.D.   On: 09/30/2023 15:27    Pertinent labs & imaging results that were available during my care of the patient were reviewed by me and considered in my medical decision making (see MDM for details).  Medications Ordered in ED Medications  ketorolac  (TORADOL ) 15 MG/ML injection 15 mg (15 mg Intramuscular Given 09/30/23 1510)  diazepam  (VALIUM ) injection 5 mg (5 mg Intramuscular Given 09/30/23 1513)                                                                                                                                      Procedures Procedures  (including critical care time)  Medical Decision Making / ED Course   MDM:  48 year old presenting to the emergency department with acute on chronic back pain after lifting objects.  Suspect symptoms likely due to muscular strain or possible herniated disc.  Consider dangerous process such as dissection, feel this is extremely unlikely, history very atypical normal mediastinal silhouette on x-ray.  No red flag symptoms for any dangerous other cause of back pain such as occult infection, spinal cord compression, cauda equina syndrome.  Patient received pain medication with improvement in symptoms.  CXR lateral view shows artifact. Unclear what this is but not present on AP view and not consistent with h/p    Patient does feel better after receiving pain medication.  Feel that she is stable for discharge at this time.  Recommended follow-up with primary physician and spine physician as needed. Will discharge patient to home. All questions answered. Patient comfortable with plan of discharge. Return precautions discussed  with patient and specified on the after visit summary.      Additional history obtained: -Additional history obtained from spouse    Imaging Studies ordered: I ordered imaging studies including XR chest, XR lumbar spine  On my interpretation imaging demonstrates no acute process, artifact present  I independently visualized and interpreted imaging. I agree with the radiologist interpretation   Medicines ordered and prescription drug management: Meds ordered this encounter  Medications   ketorolac  (TORADOL ) 15 MG/ML injection 15 mg   diazepam  (VALIUM ) injection 5 mg   methocarbamol  (ROBAXIN ) 500 MG tablet    Sig: Take 1 tablet (500 mg total) by mouth 2 (two) times daily.    Dispense:  20 tablet    Refill:  0    -I have  reviewed the patients home medicines and have made adjustments as needed   Social Determinants of Health:  Diagnosis or treatment significantly limited by social determinants of health: obesity   Reevaluation: After the interventions noted above, I reevaluated the patient and found that their symptoms have improved  Co morbidities that complicate the patient evaluation  Past Medical History:  Diagnosis Date   Anxiety    Asthma    Back pain    Borderline diabetes    Cervical cancer (HCC)    Depression    Restless leg    Thyroid disease       Dispostion: Disposition decision including need for hospitalization was considered, and patient discharged from emergency department.    Final Clinical Impression(s) / ED Diagnoses Final diagnoses:  Acute on chronic back pain     This chart was dictated using voice recognition software.  Despite best efforts to proofread,  errors can occur which can change the documentation meaning.    Francesca Elsie CROME, MD 09/30/23 [phone]

## 2023-09-30 NOTE — ED Triage Notes (Signed)
 Pt c/o of chronic back pain that became worse yesterday. States she has new numbness running down legs bilaterally & upper back pain that is radiating up her neck.

## 2023-10-01 ENCOUNTER — Telehealth: Payer: Self-pay

## 2023-10-01 NOTE — Transitions of Care (Post Inpatient/ED Visit) (Signed)
   10/01/2023  Name: Cynthia Thompson MRN: [id number] DOB: [birth year]  Today's TOC FU Call Status: Today's TOC FU Call Status:: Successful TOC FU Call Completed Unsuccessful Call (1st Attempt) Date: 10/01/23 [facility] FU Call Complete Date: 10/01/23 Patient's Name and Date of Birth confirmed.  Transition Care Management Follow-up Telephone Call Date of Discharge: 09/30/23 [facility] Type of Discharge: Emergency Department Reason for ED Visit: Other: (Dorsalgia) How have you been since you were released from the hospital?: Same Any questions or concerns?: No  Items Reviewed: Did you receive and understand the discharge instructions provided?: Yes Medications obtained,verified, and reconciled?: Yes (Medications Reviewed) Any new allergies since your discharge?: No Dietary orders reviewed?: NA Do you have support at home?: Yes  Medications Reviewed Today: Medications Reviewed Today     Reviewed by Bessy Reaney, Steva SAILOR, CMA (Certified Medical Assistant) on 10/01/23 at 1135  Med List Status: <None>   Medication Order Taking? Sig Documenting Provider Last Dose Status Informant  albuterol  (VENTOLIN  HFA) 108 (90 Base) MCG/ACT inhaler [id number] Yes Inhale 2 puffs into the lungs every 6 (six) hours as needed for wheezing or shortness of breath. Manya Toribio SQUIBB, PA  Active   amitriptyline  (ELAVIL ) 25 MG tablet [id number] Yes Take 1 tablet (25 mg total) by mouth at bedtime. Manya Toribio SQUIBB, PA  Active   chlorhexidine  (PERIDEX ) 0.12 % solution [id number] Yes Use as directed 15 mLs in the mouth or throat 2 (two) times daily. Poggi, Jenna E, PA-C  Active   levothyroxine  (SYNTHROID ) 50 MCG tablet [id number] Yes Take 1 tablet (50 mcg total) by mouth daily. Take on empty stomach, first thing in the morning, full glass of water, 30 min before anything else Manya Toribio SQUIBB, PA  Active   methocarbamol  (ROBAXIN ) 500 MG tablet [id number] Yes Take 1 tablet (500 mg total) by mouth 2 (two)  times daily. Francesca Elsie CROME, MD  Active   pantoprazole  (PROTONIX ) 40 MG tablet [id number] Yes Take 1 tablet (40 mg total) by mouth daily. Manya Toribio SQUIBB, PA  Active             Home Care and Equipment/Supplies: Were Home Health Services Ordered?: NA Any new equipment or medical supplies ordered?: NA  Functional Questionnaire: Do you need assistance with bathing/showering or dressing?: No Do you need assistance with meal preparation?: No Do you need assistance with eating?: No Do you have difficulty maintaining continence: No Do you need assistance with getting out of bed/getting out of a chair/moving?: No Do you have difficulty managing or taking your medications?: No  Follow up appointments reviewed: PCP Follow-up appointment confirmed?: Yes Date of PCP follow-up appointment?: 10/02/23 Follow-up Provider: Rolan Manya [facility] Follow-up appointment confirmed?: NA Do you need transportation to your follow-up appointment?: No Do you understand care options if your condition(s) worsen?: Yes-patient verbalized understanding    SIGNATURE Steva Julus Kelley, CMA

## 2023-10-01 NOTE — Transitions of Care (Post Inpatient/ED Visit) (Signed)
   10/01/2023  Name: Cynthia Thompson MRN: [id number] DOB: [birth year]  Today's TOC FU Call Status: Today's TOC FU Call Status:: Unsuccessful Call (1st Attempt) Unsuccessful Call (1st Attempt) Date: 10/01/23  Attempted to reach the patient regarding the most recent Inpatient/ED visit.  Follow Up Plan: Additional outreach attempts will be made to reach the patient to complete the Transitions of Care (Post Inpatient/ED visit) call.   [employer], CMA

## 2023-10-01 NOTE — Telephone Encounter (Signed)
 Called pt completed TOC call.  KP

## 2023-10-01 NOTE — Telephone Encounter (Signed)
 Copied from CRM [id number]. Topic: General - Other >> Oct 01, 2023  9:05 AM Antwanette L wrote: Reason for CRM: Patient is returning a missed call from Daune Divirgilio about a recent inpatient/ ed visit. Patient is requesting a callback at [phone]

## 2023-10-01 NOTE — Telephone Encounter (Signed)
 Error.     KP

## 2023-10-02 ENCOUNTER — Encounter: Payer: Self-pay | Admitting: Physician Assistant

## 2023-10-02 ENCOUNTER — Inpatient Hospital Stay: Payer: Self-pay | Admitting: Physician Assistant
